# Patient Record
Sex: Male | Born: 1955 | Race: White | Hispanic: No | Marital: Married | Smoking: Never smoker
Health system: Southern US, Community
[De-identification: ages and names within clinical notes are randomized; demographics above are authoritative.]

## PROBLEM LIST (undated history)

## (undated) DIAGNOSIS — M1990 Unspecified osteoarthritis, unspecified site: Secondary | ICD-10-CM

## (undated) DIAGNOSIS — N189 Chronic kidney disease, unspecified: Secondary | ICD-10-CM

## (undated) DIAGNOSIS — J189 Pneumonia, unspecified organism: Secondary | ICD-10-CM

## (undated) HISTORY — PX: HAND TENDON SURGERY: SHX663

## (undated) HISTORY — PX: APPENDECTOMY: SHX54

## (undated) HISTORY — PX: TONSILLECTOMY: SUR1361

## (undated) HISTORY — PX: BACK SURGERY: SHX140

---

## 2005-04-14 ENCOUNTER — Ambulatory Visit: Payer: Self-pay | Admitting: General Practice

## 2006-05-19 ENCOUNTER — Ambulatory Visit: Payer: Self-pay | Admitting: Gastroenterology

## 2008-03-19 ENCOUNTER — Emergency Department (HOSPITAL_COMMUNITY): Admission: EM | Admit: 2008-03-19 | Discharge: 2008-03-19 | Payer: Self-pay | Admitting: Emergency Medicine

## 2009-07-03 ENCOUNTER — Ambulatory Visit: Payer: Self-pay | Admitting: General Practice

## 2009-07-14 ENCOUNTER — Ambulatory Visit: Payer: Self-pay | Admitting: General Practice

## 2009-10-17 ENCOUNTER — Ambulatory Visit: Payer: Self-pay | Admitting: Gastroenterology

## 2010-07-28 ENCOUNTER — Encounter (HOSPITAL_COMMUNITY)
Admission: RE | Admit: 2010-07-28 | Discharge: 2010-07-28 | Disposition: A | Discharge status: Home / Self Care | Payer: BC Managed Care – PPO | Source: Ambulatory Visit | Attending: Neurosurgery | Admitting: Neurosurgery

## 2010-07-28 DIAGNOSIS — Z01812 Encounter for preprocedural laboratory examination: Secondary | ICD-10-CM | POA: Insufficient documentation

## 2010-07-28 DIAGNOSIS — Q762 Congenital spondylolisthesis: Secondary | ICD-10-CM | POA: Insufficient documentation

## 2010-07-28 LAB — TYPE AND SCREEN
ABO/RH(D): A NEG
Antibody Screen: NEGATIVE

## 2010-07-28 LAB — CBC
Hemoglobin: 15.6 g/dL (ref 13.0–17.0)
MCH: 30.6 pg (ref 26.0–34.0)
MCHC: 34.7 g/dL (ref 30.0–36.0)
Platelets: 167 10*3/uL (ref 150–400)
RBC: 5.09 MIL/uL (ref 4.22–5.81)

## 2010-07-30 ENCOUNTER — Inpatient Hospital Stay (HOSPITAL_COMMUNITY): Payer: BC Managed Care – PPO

## 2010-07-30 ENCOUNTER — Observation Stay (HOSPITAL_COMMUNITY)
Admission: RE | Admit: 2010-07-30 | Discharge: 2010-07-31 | DRG: 756 | Disposition: A | Discharge status: Home / Self Care | Payer: BC Managed Care – PPO | Source: Ambulatory Visit | Attending: Neurosurgery | Admitting: Neurosurgery

## 2010-07-30 DIAGNOSIS — G8929 Other chronic pain: Secondary | ICD-10-CM | POA: Insufficient documentation

## 2010-07-30 DIAGNOSIS — Z01812 Encounter for preprocedural laboratory examination: Secondary | ICD-10-CM

## 2010-07-30 DIAGNOSIS — M549 Dorsalgia, unspecified: Secondary | ICD-10-CM | POA: Insufficient documentation

## 2010-07-30 DIAGNOSIS — Q762 Congenital spondylolisthesis: Principal | ICD-10-CM | POA: Diagnosis present

## 2010-08-09 NOTE — Op Note (Signed)
NAMEBRONCO, Steven Tate             ACCOUNT NO.:  [id number]  MEDICAL RECORD NO.:  [id number]           PATIENT TYPE:  I  LOCATION:  3034                         FACILITY:  [facility]  PHYSICIAN:  Reinaldo Meeker, M.D. 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  PROCEDURE:  Bilateral L5-S1 decompression with decompression of L5 and S1 nerve roots bilaterally more so than needed for posterior lumbar interbody fusion followed by L5-S1 bilateral microdiskectomy followed by L5-S1 posterior lumbar interbody fusion with PEEK interbody spacer followed by L5-S1 pedicle screw fixation with Sequoia pedicle screw system followed by L5-S1 posterolateral fusion.  SURGEON:  Reinaldo Meeker, MD  ASSISTANT:  Tia Alert, MD  PROCEDURE IN DETAIL:  After being placed in the prone position, the patient's back was prepped and draped in the usual sterile fashion. Localizing fluoroscopy was used prior to incision to identify the appropriate level.  Midline incision was made above the spinous process of L4, L5 and S1.  Using Bovie cutting current, the incision was carried on the spinous process appeared.  Subperiosteal dissection was then carried out bilaterally on the spinous processes and lamina, far lateral region of L5 and exposed the far lateral aspect of the sacrum.  Self- retaining retractor was placed for exposure and x-rays showed approached to the appropriate level.  Spinous processes of L5 and S1 were removed. Free-floating lamina and inferior facet of L5 were then removed. Superior facet of S1 was then removed.  The abnormal bony overgrowth in the foramen compressing the L5 nerve root was removed bilaterally, so the L5 and S1  nerve roots were both visualized well.  At this time, inspection was carried out bilaterally for any evidence of residual compression, none could be identified.  The disk space was then incised with 15-blade, sequentially distracted up to a 9-mm size.  It was then aggressively cleaned out with a variety of pituitary rongeurs and curettes.  Thorough disk space clean-up was carried out.  At the same time, a great care was taken to avoid injury to the neural elements and this was successfully done.  At this point, disk space was repaired with a variety of scraping instruments for posterior lumbar interbody fusion with PEEK interbody spacer.  A 9 x 10 x 22-mm spacer was chosen and then filled with a mixture of EquivaBone, OsteoSet Plus and autologous bone. This was impacted in the disk space without difficulty.  Fluoroscopy showed to be in good position.  A 9-mm retractor was removed from the opposite side, which was then prepared for interbody fusion as well. Prior to placing the second spacer, we placed a mixture of OsteoSet Plus, EquivaBone and autologous bone in the midline to help with the interbody fusion.  Then impacted the second spacer.  Fluoroscopy showed both spacers to be in good position.  We then placed pedicle screws in standard fashion.  We chose entry point to passed a pedicle  awl.  We then tapped with 5.5-mm tap and placed 65 x 45-mm screws at L5 and 65 x 35-mm screw on one side of S1 and 65 x 40-mm screw on the other side. Fluoroscopy showed that to be in good position.  We then decorticated the far lateral region and placed a mixture of autologous bone, EquivaBone and OsteoSet Plus for posterolateral fusion.  We then tacked the rods and did final tightening with torque and counter torque.  As we tightened the second screw bilaterally, we did compression of L5 and S1. Final fluoroscopy in AP and lateral direction showed good placement of the interbody devices as long as the  screws and rods.  Large amounts of irrigation was carried out.  Any bleeding controlled with bipolar coagulation and Gelfoam.  Hemovac drain was left in the epidural space and brought out through separate stab wound incision.  The wound was then closed in multiple layers of Vicryl in the muscle fascia, subcutaneous and subcuticular tissues.  Staples were placed on the skin. Sterile dressing was then applied and the patient was extubated and taken to the recovery room in stable condition.          ______________________________ Reinaldo Meeker, M.D.     ROK/MEDQ  D:  07/30/2010  T:  07/31/2010  Job:  [id number]  Electronically Signed by Aliene Beams M.D. on 08/09/2010 10:50:38 PM

## 2010-09-09 ENCOUNTER — Ambulatory Visit
Admission: RE | Admit: 2010-09-09 | Discharge: 2010-09-09 | Disposition: A | Discharge status: Home / Self Care | Payer: BC Managed Care – PPO | Source: Ambulatory Visit | Attending: Neurosurgery | Admitting: Neurosurgery

## 2010-09-09 ENCOUNTER — Other Ambulatory Visit: Payer: Self-pay | Admitting: Neurosurgery

## 2010-09-09 DIAGNOSIS — Q762 Congenital spondylolisthesis: Secondary | ICD-10-CM

## 2010-09-16 ENCOUNTER — Ambulatory Visit (HOSPITAL_COMMUNITY)
Admission: RE | Admit: 2010-09-16 | Discharge: 2010-09-16 | Disposition: A | Discharge status: Home / Self Care | Payer: BC Managed Care – PPO | Source: Ambulatory Visit | Attending: Neurosurgery | Admitting: Neurosurgery

## 2010-09-16 DIAGNOSIS — R262 Difficulty in walking, not elsewhere classified: Secondary | ICD-10-CM | POA: Insufficient documentation

## 2010-09-16 DIAGNOSIS — M6281 Muscle weakness (generalized): Secondary | ICD-10-CM | POA: Insufficient documentation

## 2010-09-16 DIAGNOSIS — M545 Low back pain, unspecified: Secondary | ICD-10-CM | POA: Insufficient documentation

## 2010-09-16 DIAGNOSIS — IMO0001 Reserved for inherently not codable concepts without codable children: Secondary | ICD-10-CM | POA: Insufficient documentation

## 2010-09-16 DIAGNOSIS — M25659 Stiffness of unspecified hip, not elsewhere classified: Secondary | ICD-10-CM | POA: Insufficient documentation

## 2010-09-18 ENCOUNTER — Inpatient Hospital Stay (HOSPITAL_COMMUNITY)
Admission: RE | Admit: 2010-09-18 | Discharge: 2010-09-18 | Disposition: A | Discharge status: Home / Self Care | Payer: BC Managed Care – PPO | Source: Ambulatory Visit | Attending: Physical Therapy | Admitting: Physical Therapy

## 2010-09-22 ENCOUNTER — Ambulatory Visit (HOSPITAL_COMMUNITY)
Admission: RE | Admit: 2010-09-22 | Discharge: 2010-09-22 | Disposition: A | Discharge status: Home / Self Care | Payer: BC Managed Care – PPO | Source: Ambulatory Visit | Attending: Neurosurgery | Admitting: Neurosurgery

## 2010-09-22 DIAGNOSIS — M6281 Muscle weakness (generalized): Secondary | ICD-10-CM | POA: Insufficient documentation

## 2010-09-22 DIAGNOSIS — M25659 Stiffness of unspecified hip, not elsewhere classified: Secondary | ICD-10-CM | POA: Insufficient documentation

## 2010-09-22 DIAGNOSIS — IMO0001 Reserved for inherently not codable concepts without codable children: Secondary | ICD-10-CM | POA: Insufficient documentation

## 2010-09-22 DIAGNOSIS — M545 Low back pain, unspecified: Secondary | ICD-10-CM | POA: Insufficient documentation

## 2010-09-22 DIAGNOSIS — R262 Difficulty in walking, not elsewhere classified: Secondary | ICD-10-CM | POA: Insufficient documentation

## 2010-09-24 ENCOUNTER — Ambulatory Visit (HOSPITAL_COMMUNITY)
Admission: RE | Admit: 2010-09-24 | Discharge: 2010-09-24 | Disposition: A | Discharge status: Home / Self Care | Payer: BC Managed Care – PPO | Source: Ambulatory Visit

## 2010-09-29 ENCOUNTER — Ambulatory Visit (HOSPITAL_COMMUNITY)
Admission: RE | Admit: 2010-09-29 | Discharge: 2010-09-29 | Disposition: A | Discharge status: Home / Self Care | Payer: BC Managed Care – PPO | Source: Ambulatory Visit | Attending: Neurosurgery | Admitting: Neurosurgery

## 2010-09-29 DIAGNOSIS — M545 Low back pain, unspecified: Secondary | ICD-10-CM | POA: Insufficient documentation

## 2010-09-29 DIAGNOSIS — M6281 Muscle weakness (generalized): Secondary | ICD-10-CM | POA: Insufficient documentation

## 2010-09-29 DIAGNOSIS — R262 Difficulty in walking, not elsewhere classified: Secondary | ICD-10-CM | POA: Insufficient documentation

## 2010-09-29 DIAGNOSIS — M25659 Stiffness of unspecified hip, not elsewhere classified: Secondary | ICD-10-CM | POA: Insufficient documentation

## 2010-09-29 DIAGNOSIS — IMO0001 Reserved for inherently not codable concepts without codable children: Secondary | ICD-10-CM | POA: Insufficient documentation

## 2010-10-01 ENCOUNTER — Ambulatory Visit (HOSPITAL_COMMUNITY)
Admission: RE | Admit: 2010-10-01 | Discharge: 2010-10-01 | Disposition: A | Discharge status: Home / Self Care | Payer: BC Managed Care – PPO | Source: Ambulatory Visit | Admitting: Physical Therapy

## 2010-10-06 ENCOUNTER — Ambulatory Visit (HOSPITAL_COMMUNITY)
Admission: RE | Admit: 2010-10-06 | Discharge: 2010-10-06 | Disposition: A | Discharge status: Home / Self Care | Payer: BC Managed Care – PPO | Source: Ambulatory Visit | Attending: *Deleted | Admitting: *Deleted

## 2010-10-08 ENCOUNTER — Ambulatory Visit (HOSPITAL_COMMUNITY)
Admission: RE | Admit: 2010-10-08 | Discharge: 2010-10-08 | Disposition: A | Discharge status: Home / Self Care | Payer: BC Managed Care – PPO | Source: Ambulatory Visit | Attending: *Deleted | Admitting: *Deleted

## 2010-10-13 ENCOUNTER — Ambulatory Visit (HOSPITAL_COMMUNITY): Payer: BC Managed Care – PPO | Admitting: *Deleted

## 2010-10-15 ENCOUNTER — Ambulatory Visit (HOSPITAL_COMMUNITY)
Admission: RE | Admit: 2010-10-15 | Discharge: 2010-10-15 | Disposition: A | Discharge status: Home / Self Care | Payer: BC Managed Care – PPO | Source: Ambulatory Visit | Attending: *Deleted | Admitting: *Deleted

## 2010-10-21 ENCOUNTER — Other Ambulatory Visit: Payer: Self-pay | Admitting: Neurosurgery

## 2010-10-21 ENCOUNTER — Ambulatory Visit
Admission: RE | Admit: 2010-10-21 | Discharge: 2010-10-21 | Disposition: A | Discharge status: Home / Self Care | Payer: BC Managed Care – PPO | Source: Ambulatory Visit | Attending: Neurosurgery | Admitting: Neurosurgery

## 2010-10-21 DIAGNOSIS — M545 Low back pain, unspecified: Secondary | ICD-10-CM

## 2011-12-20 ENCOUNTER — Ambulatory Visit: Payer: Self-pay | Admitting: General Practice

## 2012-02-11 ENCOUNTER — Encounter (HOSPITAL_COMMUNITY): Payer: Self-pay | Admitting: Pharmacy Technician

## 2012-02-15 ENCOUNTER — Ambulatory Visit: Payer: Self-pay | Admitting: Gastroenterology

## 2012-02-15 NOTE — Patient Instructions (Signed)
18 Steven Tate  02/15/2012   Your procedure is scheduled on:  02/22/12 1200noon-130pm  Report to [facility] at 0930 AM.  Call this number if you have problems the morning of surgery: [phone]   Remember:   Do not eat food:After Midnight.  May have clear liquids:until Midnight .    Take these medicines the morning of surgery with A SIP OF WATER:    Do not wear jewelry,   Do not wear lotions, powders, or perfumes. .  . Men may shave face and neck.  Do not bring valuables to the hospital.  Contacts, dentures or bridgework may not be worn into surgery.  Leave suitcase in the car. After surgery it may be brought to your room.  For patients admitted to the hospital, checkout time is 11:00 AM the day of discharge.   Special Instructions: CHG Shower Use Special Wash: 1/2 bottle night before surgery and 1/2 bottle morning of surgery. shower chin to toes with CHG.  Wash face and private parts with regular soap.    Please read over the following fact sheets that you were given: MRSA Information, Incentive Spirometry Fact Sheet, Blood Transfusion FAct Sheet, coughing and deep breathing exercise, leg exercises

## 2012-02-16 ENCOUNTER — Encounter (HOSPITAL_COMMUNITY): Payer: Self-pay

## 2012-02-16 ENCOUNTER — Encounter (HOSPITAL_COMMUNITY)
Admission: RE | Admit: 2012-02-16 | Discharge: 2012-02-16 | Disposition: A | Discharge status: Home / Self Care | Payer: BC Managed Care – PPO | Source: Ambulatory Visit | Attending: Orthopedic Surgery | Admitting: Orthopedic Surgery

## 2012-02-16 HISTORY — DX: Unspecified osteoarthritis, unspecified site: M19.90

## 2012-02-16 HISTORY — DX: Chronic kidney disease, unspecified: N18.9

## 2012-02-16 HISTORY — DX: Pneumonia, unspecified organism: J18.9

## 2012-02-16 LAB — URINALYSIS, ROUTINE W REFLEX MICROSCOPIC
Bilirubin Urine: NEGATIVE
Leukocytes, UA: NEGATIVE
Nitrite: NEGATIVE
Specific Gravity, Urine: 1.017 (ref 1.005–1.030)
Urobilinogen, UA: 0.2 mg/dL (ref 0.0–1.0)
pH: 5.5 (ref 5.0–8.0)

## 2012-02-16 LAB — BASIC METABOLIC PANEL
BUN: 13 mg/dL (ref 6–23)
Chloride: 104 mEq/L (ref 96–112)
GFR calc Af Amer: >90 mL/min (ref >90)
GFR calc non Af Amer: >90 mL/min (ref >90)
Glucose, Bld: 104 mg/dL — ABNORMAL HIGH (ref 70–99)
Potassium: 3.9 mEq/L (ref 3.5–5.1)
Sodium: 136 mEq/L (ref 135–145)

## 2012-02-16 LAB — CBC
HCT: 44.2 % (ref 39.0–52.0)
Hemoglobin: 15.4 g/dL (ref 13.0–17.0)
MCHC: 34.8 g/dL (ref 30.0–36.0)
RBC: 5.19 MIL/uL (ref 4.22–5.81)

## 2012-02-16 LAB — SURGICAL PCR SCREEN
MRSA, PCR: NEGATIVE
Staphylococcus aureus: NEGATIVE

## 2012-02-16 LAB — PROTIME-INR
INR: 0.97 (ref 0.00–1.49)
Prothrombin Time: 13.1 seconds (ref 11.6–15.2)

## 2012-02-17 NOTE — H&P (Signed)
Steven Tate is an 56 y.o. male.    Chief Complaint:  Right hip OA and pain   HPI: Pt is a 57 y.o. male complaining of hip pain for about 30 years, when he was diagnosed with a spur on the left hip. Five years ago he started having right hip pain.  Pain had continually increased since the beginning, to the point where he has significant pain and almost no motion in the right hip and significantly decreased motion and pain in the left hip. Patient feels that the right hip is worse than the left hip at this time.X-rays in the clinic show end-stage arthritic changes of the bilateral hip. Pt has tried various conservative treatments which have failed to alleviate their symptoms. Various options are discussed with the patient. Risks, benefits and expectations were discussed with the patient. Patient understand the risks, benefits and expectations and wishes to proceed with surgery.   PCP:  No primary provider on file.  D/C Plans:  Home with HHPT  Post-op Meds:   Rx given for ASA, Robaxin, Celebrex, Iron, Colace and MiraLax  Tranexamic Acid:   To be given  Decadron:   To be given  PMH: Past Medical History  Diagnosis Date  . Pneumonia     hx of 2001  . GERD (gastroesophageal reflux disease)   . Chronic kidney disease     hx of uti   . Arthritis     PSH: Past Surgical History  Procedure Date  . Back surgery   . Appendectomy   . Tonsillectomy     Social History:  reports that he has never smoked. He quit smokeless tobacco use about 4 years ago. His smokeless tobacco use included Snuff. He reports that he does not drink alcohol or use illicit drugs.  Allergies:  No Known Allergies  Medications: No current facility-administered medications for this encounter.   Current Outpatient Prescriptions  Medication Sig Dispense Refill  . meloxicam (MOBIC) 15 MG tablet Take 15 mg by mouth daily with breakfast.      . omeprazole (PRILOSEC) 20 MG capsule Take 20 mg by mouth daily.       Marland Kitchen testosterone enanthate (DELATESTRYL) 200 MG/ML injection Inject 200 mg into the muscle every 28 (twenty-eight) days. For IM use only      . zolpidem (AMBIEN) 10 MG tablet Take 10 mg by mouth at bedtime as needed. Sleep        Results for orders placed during the hospital encounter of 02/16/12 (from the past 48 hour(s))  URINALYSIS, ROUTINE W REFLEX MICROSCOPIC     Status: Normal   Collection Time   02/16/12  1:46 PM      Component Value Range Comment   Color, Urine YELLOW  YELLOW    APPearance CLEAR  CLEAR    Specific Gravity, Urine 1.017  1.005 - 1.030    pH 5.5  5.0 - 8.0    Glucose, UA NEGATIVE  NEGATIVE mg/dL    Hgb urine dipstick NEGATIVE  NEGATIVE    Bilirubin Urine NEGATIVE  NEGATIVE    Ketones, ur NEGATIVE  NEGATIVE mg/dL    Protein, ur NEGATIVE  NEGATIVE mg/dL    Urobilinogen, UA 0.2  0.0 - 1.0 mg/dL    Nitrite NEGATIVE  NEGATIVE    Leukocytes, UA NEGATIVE  NEGATIVE MICROSCOPIC NOT DONE ON URINES WITH NEGATIVE PROTEIN, BLOOD, LEUKOCYTES, NITRITE, OR GLUCOSE <1000 mg/dL.  SURGICAL PCR SCREEN     Status: Normal   Collection Time  02/16/12  1:46 PM      Component Value Range Comment   MRSA, PCR NEGATIVE  NEGATIVE    Staphylococcus aureus NEGATIVE  NEGATIVE   APTT     Status: Normal   Collection Time   02/16/12  2:15 PM      Component Value Range Comment   aPTT 33  24 - 37 seconds   BASIC METABOLIC PANEL     Status: Abnormal   Collection Time   02/16/12  2:15 PM      Component Value Range Comment   Sodium 136  135 - 145 mEq/L    Potassium 3.9  3.5 - 5.1 mEq/L    Chloride 104  96 - 112 mEq/L    CO2 23  19 - 32 mEq/L    Glucose, Bld 104 (*) 70 - 99 mg/dL    BUN 13  6 - 23 mg/dL    Creatinine, Ser 1.61  0.50 - 1.35 mg/dL    Calcium 9.0  8.4 - 09.6 mg/dL    GFR calc non Af Amer >90  >90 mL/min    GFR calc Af Amer >90  >90 mL/min   CBC     Status: Normal   Collection Time   02/16/12  2:15 PM      Component Value Range Comment   WBC 9.1  4.0 - 10.5 K/uL    RBC 5.19   4.22 - 5.81 MIL/uL    Hemoglobin 15.4  13.0 - 17.0 g/dL    HCT 04.5  40.9 - 81.1 %    MCV 85.2  78.0 - 100.0 fL    MCH 29.7  26.0 - 34.0 pg    MCHC 34.8  30.0 - 36.0 g/dL    RDW 91.4  78.2 - 95.6 %    Platelets 225  150 - 400 K/uL   PROTIME-INR     Status: Normal   Collection Time   02/16/12  2:15 PM      Component Value Range Comment   Prothrombin Time 13.1  11.6 - 15.2 seconds    INR 0.97  0.00 - 1.49     ROS: Review of Systems  Constitutional: Negative.   HENT: Positive for tinnitus.   Eyes: Negative.   Respiratory: Negative.   Cardiovascular: Negative.   Gastrointestinal: Negative.   Genitourinary: Negative.   Musculoskeletal: Positive for myalgias, back pain and joint pain.  Skin: Negative.   Neurological: Negative.   Endo/Heme/Allergies: Negative.   Psychiatric/Behavioral: The patient has insomnia.      Physical Exam: BP:   133/75  ;  HR:   75  ; Resp:   16  ; Physical Exam  Constitutional: He is oriented to person, place, and time and well-developed, well-nourished, and in no distress.  HENT:  Head: Normocephalic and atraumatic.  Nose: Nose normal.  Mouth/Throat: Oropharynx is clear and moist.  Eyes: Pupils are equal, round, and reactive to light.  Neck: Neck supple. No JVD present. No tracheal deviation present. No thyromegaly present.  Cardiovascular: Normal rate, regular rhythm, normal heart sounds and intact distal pulses.   Pulmonary/Chest: Effort normal and breath sounds normal. No stridor.  Abdominal: Soft. There is no tenderness. There is no guarding.  Musculoskeletal:       Right hip: He exhibits decreased range of motion, decreased strength, tenderness and bony tenderness. He exhibits no swelling, no deformity and no laceration.       Left hip: He exhibits decreased range of motion, decreased strength, tenderness  and bony tenderness. He exhibits no swelling, no deformity and no laceration.       Right ankle: He exhibits normal pulse.  Lymphadenopathy:      He has no cervical adenopathy.  Neurological: He is alert and oriented to person, place, and time.  Skin: Skin is warm and dry.  Psychiatric: Affect normal.     Assessment/Plan Assessment:   Right hip OA and pain   Plan: Patient will undergo a right total hip arthroplasty, anterior approach on 02/22/2012 per Dr. Charlann Boxer at [facility]. Risks benefits and expectations were discussed with the patient. Patient understand risks, benefits and expectations and wishes to proceed.   Anastasio Auerbach Glenn Gullickson   PAC  02/17/2012, 3:05 PM

## 2012-02-22 ENCOUNTER — Ambulatory Visit (HOSPITAL_COMMUNITY): Payer: BC Managed Care – PPO

## 2012-02-22 ENCOUNTER — Inpatient Hospital Stay (HOSPITAL_COMMUNITY)
Admission: RE | Admit: 2012-02-22 | Discharge: 2012-02-23 | DRG: 818 | Disposition: A | Discharge status: Home Health | Payer: BC Managed Care – PPO | Source: Ambulatory Visit | Attending: Orthopedic Surgery | Admitting: Orthopedic Surgery

## 2012-02-22 ENCOUNTER — Encounter (HOSPITAL_COMMUNITY)
Admission: RE | Disposition: A | Discharge status: Home Health | Payer: Self-pay | Source: Ambulatory Visit | Attending: Orthopedic Surgery

## 2012-02-22 ENCOUNTER — Encounter (HOSPITAL_COMMUNITY): Payer: Self-pay | Admitting: *Deleted

## 2012-02-22 ENCOUNTER — Encounter (HOSPITAL_COMMUNITY): Payer: Self-pay | Admitting: Anesthesiology

## 2012-02-22 ENCOUNTER — Ambulatory Visit (HOSPITAL_COMMUNITY): Payer: BC Managed Care – PPO | Admitting: Anesthesiology

## 2012-02-22 DIAGNOSIS — M169 Osteoarthritis of hip, unspecified: Principal | ICD-10-CM | POA: Diagnosis present

## 2012-02-22 DIAGNOSIS — D62 Acute posthemorrhagic anemia: Secondary | ICD-10-CM | POA: Diagnosis not present

## 2012-02-22 DIAGNOSIS — M1610 Unilateral primary osteoarthritis, unspecified hip: Principal | ICD-10-CM | POA: Diagnosis present

## 2012-02-22 DIAGNOSIS — Z01812 Encounter for preprocedural laboratory examination: Secondary | ICD-10-CM

## 2012-02-22 DIAGNOSIS — D500 Iron deficiency anemia secondary to blood loss (chronic): Secondary | ICD-10-CM

## 2012-02-22 DIAGNOSIS — Z96649 Presence of unspecified artificial hip joint: Secondary | ICD-10-CM

## 2012-02-22 HISTORY — PX: TOTAL HIP ARTHROPLASTY: SHX124

## 2012-02-22 LAB — TYPE AND SCREEN: ABO/RH(D): A NEG

## 2012-02-22 SURGERY — ARTHROPLASTY, HIP, TOTAL, ANTERIOR APPROACH
Anesthesia: Spinal | Site: Hip | Laterality: Right | Wound class: Clean

## 2012-02-22 MED ORDER — PHENYLEPHRINE HCL 10 MG/ML IJ SOLN
INTRAMUSCULAR | Status: DC | PRN
  Administered 2012-02-22 (×4): 40 ug via INTRAVENOUS

## 2012-02-22 MED ORDER — METHOCARBAMOL 100 MG/ML IJ SOLN
500.0000 mg | Freq: Four times a day (QID) | INTRAVENOUS | Status: DC | PRN
  Filled 2012-02-22: qty 5

## 2012-02-22 MED ORDER — SODIUM CHLORIDE 0.9 % IV SOLN
100.0000 mL/h | INTRAVENOUS | Status: DC
  Administered 2012-02-22: 100 mL/h via INTRAVENOUS
  Filled 2012-02-22 (×5): qty 1000

## 2012-02-22 MED ORDER — FLEET ENEMA 7-19 GM/118ML RE ENEM: 1.0000 | ENEMA | Freq: Once | RECTAL | Status: AC | PRN

## 2012-02-22 MED ORDER — ZOLPIDEM TARTRATE 10 MG PO TABS: 10.0000 mg | ORAL_TABLET | Freq: Every evening | ORAL | Status: DC | PRN

## 2012-02-22 MED ORDER — CEFAZOLIN SODIUM-DEXTROSE 2-3 GM-% IV SOLR
2.0000 g | INTRAVENOUS | Status: AC
  Administered 2012-02-22: 2 g via INTRAVENOUS

## 2012-02-22 MED ORDER — PANTOPRAZOLE SODIUM 40 MG PO TBEC
40.0000 mg | DELAYED_RELEASE_TABLET | Freq: Every day | ORAL | Status: DC
  Administered 2012-02-23: 40 mg via ORAL
  Filled 2012-02-22: qty 1

## 2012-02-22 MED ORDER — FERROUS SULFATE 325 (65 FE) MG PO TABS
325.0000 mg | ORAL_TABLET | Freq: Three times a day (TID) | ORAL | Status: DC
  Administered 2012-02-22 – 2012-02-23 (×3): 325 mg via ORAL
  Filled 2012-02-22 (×5): qty 1

## 2012-02-22 MED ORDER — DEXAMETHASONE SODIUM PHOSPHATE 10 MG/ML IJ SOLN
10.0000 mg | Freq: Once | INTRAMUSCULAR | Status: DC
  Filled 2012-02-22: qty 1

## 2012-02-22 MED ORDER — PROPOFOL INFUSION 10 MG/ML OPTIME
INTRAVENOUS | Status: DC | PRN
  Administered 2012-02-22: 50 ug/kg/min via INTRAVENOUS

## 2012-02-22 MED ORDER — ACETAMINOPHEN 10 MG/ML IV SOLN
INTRAVENOUS | Status: DC | PRN
  Administered 2012-02-22: 1000 mg via INTRAVENOUS

## 2012-02-22 MED ORDER — MENTHOL 3 MG MT LOZG: 1.0000 | LOZENGE | OROMUCOSAL | Status: DC | PRN

## 2012-02-22 MED ORDER — ONDANSETRON HCL 4 MG/2ML IJ SOLN
4.0000 mg | Freq: Four times a day (QID) | INTRAMUSCULAR | Status: DC | PRN
  Administered 2012-02-23: 4 mg via INTRAVENOUS
  Filled 2012-02-22: qty 2

## 2012-02-22 MED ORDER — METHOCARBAMOL 500 MG PO TABS
500.0000 mg | ORAL_TABLET | Freq: Four times a day (QID) | ORAL | Status: DC | PRN
  Administered 2012-02-22 – 2012-02-23 (×3): 500 mg via ORAL
  Filled 2012-02-22 (×3): qty 1

## 2012-02-22 MED ORDER — PROPOFOL 10 MG/ML IV BOLUS
INTRAVENOUS | Status: DC | PRN
  Administered 2012-02-22 (×4): 20 mg via INTRAVENOUS

## 2012-02-22 MED ORDER — DOCUSATE SODIUM 100 MG PO CAPS
100.0000 mg | ORAL_CAPSULE | Freq: Two times a day (BID) | ORAL | Status: DC
  Administered 2012-02-22 – 2012-02-23 (×2): 100 mg via ORAL

## 2012-02-22 MED ORDER — CEFAZOLIN SODIUM-DEXTROSE 2-3 GM-% IV SOLR
2.0000 g | Freq: Four times a day (QID) | INTRAVENOUS | Status: AC
  Administered 2012-02-22 (×2): 2 g via INTRAVENOUS
  Filled 2012-02-22 (×2): qty 50

## 2012-02-22 MED ORDER — ONDANSETRON HCL 4 MG PO TABS: 4.0000 mg | ORAL_TABLET | Freq: Four times a day (QID) | ORAL | Status: DC | PRN

## 2012-02-22 MED ORDER — BUPIVACAINE HCL (PF) 0.5 % IJ SOLN
INTRAMUSCULAR | Status: DC | PRN
  Administered 2012-02-22: 15 mg

## 2012-02-22 MED ORDER — DEXAMETHASONE SODIUM PHOSPHATE 10 MG/ML IJ SOLN
10.0000 mg | Freq: Once | INTRAMUSCULAR | Status: AC
  Administered 2012-02-23: 10 mg via INTRAVENOUS
  Filled 2012-02-22: qty 1

## 2012-02-22 MED ORDER — BISACODYL 10 MG RE SUPP: 10.0000 mg | Freq: Every day | RECTAL | Status: DC | PRN

## 2012-02-22 MED ORDER — METOCLOPRAMIDE HCL 5 MG/ML IJ SOLN
5.0000 mg | Freq: Three times a day (TID) | INTRAMUSCULAR | Status: DC | PRN
  Administered 2012-02-22: 10 mg via INTRAVENOUS
  Filled 2012-02-22: qty 2

## 2012-02-22 MED ORDER — CHLORHEXIDINE GLUCONATE 4 % EX LIQD
60.0000 mL | Freq: Once | CUTANEOUS | Status: DC
  Filled 2012-02-22: qty 60

## 2012-02-22 MED ORDER — TRANEXAMIC ACID 100 MG/ML IV SOLN
1440.0000 mg | Freq: Once | INTRAVENOUS | Status: AC
  Administered 2012-02-22: 1440 mg via INTRAVENOUS
  Filled 2012-02-22: qty 14.4

## 2012-02-22 MED ORDER — POLYETHYLENE GLYCOL 3350 17 G PO PACK
17.0000 g | PACK | Freq: Two times a day (BID) | ORAL | Status: DC
  Administered 2012-02-22 – 2012-02-23 (×2): 17 g via ORAL

## 2012-02-22 MED ORDER — LACTATED RINGERS IV SOLN
INTRAVENOUS | Status: DC
  Administered 2012-02-22: 1000 mL via INTRAVENOUS
  Administered 2012-02-22: 13:00:00 via INTRAVENOUS
  Administered 2012-02-22: 1000 mL via INTRAVENOUS

## 2012-02-22 MED ORDER — RIVAROXABAN 10 MG PO TABS
10.0000 mg | ORAL_TABLET | ORAL | Status: DC
  Administered 2012-02-23: 10 mg via ORAL
  Filled 2012-02-22 (×2): qty 1

## 2012-02-22 MED ORDER — CEFAZOLIN SODIUM-DEXTROSE 2-3 GM-% IV SOLR
INTRAVENOUS | Status: AC
  Filled 2012-02-22: qty 50

## 2012-02-22 MED ORDER — PHENOL 1.4 % MT LIQD: 1.0000 | OROMUCOSAL | Status: DC | PRN

## 2012-02-22 MED ORDER — CELECOXIB 200 MG PO CAPS
200.0000 mg | ORAL_CAPSULE | Freq: Two times a day (BID) | ORAL | Status: DC
  Administered 2012-02-22 – 2012-02-23 (×2): 200 mg via ORAL
  Filled 2012-02-22 (×3): qty 1

## 2012-02-22 MED ORDER — ALUM & MAG HYDROXIDE-SIMETH 200-200-20 MG/5ML PO SUSP: 30.0000 mL | ORAL | Status: DC | PRN

## 2012-02-22 MED ORDER — METOCLOPRAMIDE HCL 10 MG PO TABS: 5.0000 mg | ORAL_TABLET | Freq: Three times a day (TID) | ORAL | Status: DC | PRN

## 2012-02-22 MED ORDER — LACTATED RINGERS IV BOLUS (SEPSIS)
500.0000 mL | Freq: Once | INTRAVENOUS | Status: AC
  Administered 2012-02-22: 500 mL via INTRAVENOUS

## 2012-02-22 MED ORDER — HYDROMORPHONE HCL PF 1 MG/ML IJ SOLN
0.5000 mg | INTRAMUSCULAR | Status: DC | PRN
  Administered 2012-02-22 (×2): 1 mg via INTRAVENOUS
  Filled 2012-02-22 (×2): qty 1

## 2012-02-22 MED ORDER — PHENYLEPHRINE HCL 10 MG/ML IJ SOLN
10.0000 mg | INTRAVENOUS | Status: DC | PRN
  Administered 2012-02-22: 40 ug/min via INTRAVENOUS

## 2012-02-22 MED ORDER — HYDROCODONE-ACETAMINOPHEN 7.5-325 MG PO TABS
1.0000 | ORAL_TABLET | ORAL | Status: DC
  Administered 2012-02-22 – 2012-02-23 (×5): 2 via ORAL
  Filled 2012-02-22 (×5): qty 2

## 2012-02-22 MED ORDER — 0.9 % SODIUM CHLORIDE (POUR BTL) OPTIME
TOPICAL | Status: DC | PRN
  Administered 2012-02-22: 1000 mL

## 2012-02-22 MED ORDER — BUPIVACAINE HCL (PF) 0.5 % IJ SOLN
INTRAMUSCULAR | Status: AC
  Filled 2012-02-22: qty 30

## 2012-02-22 MED ORDER — ACETAMINOPHEN 10 MG/ML IV SOLN
INTRAVENOUS | Status: AC
  Filled 2012-02-22: qty 100

## 2012-02-22 MED ORDER — DIPHENHYDRAMINE HCL 25 MG PO CAPS: 25.0000 mg | ORAL_CAPSULE | Freq: Four times a day (QID) | ORAL | Status: DC | PRN

## 2012-02-22 SURGICAL SUPPLY — 38 items
BAG ZIPLOCK 12X15 (MISCELLANEOUS) ×4 IMPLANT
BLADE SAW SGTL 18X1.27X75 (BLADE) ×2 IMPLANT
CLOTH BEACON ORANGE TIMEOUT ST (SAFETY) ×2 IMPLANT
DERMABOND ADVANCED (GAUZE/BANDAGES/DRESSINGS) ×1
DERMABOND ADVANCED .7 DNX12 (GAUZE/BANDAGES/DRESSINGS) ×1 IMPLANT
DRAPE C-ARM 42X72 X-RAY (DRAPES) ×2 IMPLANT
DRAPE STERI IOBAN 125X83 (DRAPES) ×2 IMPLANT
DRAPE U-SHAPE 47X51 STRL (DRAPES) ×6 IMPLANT
DRSG AQUACEL AG ADV 3.5X10 (GAUZE/BANDAGES/DRESSINGS) ×2 IMPLANT
DRSG TEGADERM 4X4.75 (GAUZE/BANDAGES/DRESSINGS) ×2 IMPLANT
DURAPREP 26ML APPLICATOR (WOUND CARE) ×2 IMPLANT
ELECT BLADE TIP CTD 4 INCH (ELECTRODE) ×2 IMPLANT
ELECT REM PT RETURN 9FT ADLT (ELECTROSURGICAL) ×2
ELECTRODE REM PT RTRN 9FT ADLT (ELECTROSURGICAL) ×1 IMPLANT
EVACUATOR 1/8 PVC DRAIN (DRAIN) IMPLANT
FACESHIELD LNG OPTICON STERILE (SAFETY) ×8 IMPLANT
GAUZE SPONGE 2X2 8PLY STRL LF (GAUZE/BANDAGES/DRESSINGS) ×1 IMPLANT
GLOVE BIOGEL PI IND STRL 7.5 (GLOVE) ×1 IMPLANT
GLOVE BIOGEL PI IND STRL 8 (GLOVE) ×1 IMPLANT
GLOVE BIOGEL PI INDICATOR 7.5 (GLOVE) ×1
GLOVE BIOGEL PI INDICATOR 8 (GLOVE) ×1
GLOVE ECLIPSE 8.0 STRL XLNG CF (GLOVE) ×2 IMPLANT
GLOVE ORTHO TXT STRL SZ7.5 (GLOVE) ×4 IMPLANT
GOWN BRE IMP PREV XXLGXLNG (GOWN DISPOSABLE) ×4 IMPLANT
GOWN STRL NON-REIN LRG LVL3 (GOWN DISPOSABLE) ×2 IMPLANT
KIT BASIN OR (CUSTOM PROCEDURE TRAY) ×2 IMPLANT
PACK TOTAL JOINT (CUSTOM PROCEDURE TRAY) ×2 IMPLANT
PADDING CAST COTTON 6X4 STRL (CAST SUPPLIES) ×2 IMPLANT
SPONGE GAUZE 2X2 STER 10/PKG (GAUZE/BANDAGES/DRESSINGS) ×1
SUCTION FRAZIER 12FR DISP (SUCTIONS) ×2 IMPLANT
SUT MNCRL AB 4-0 PS2 18 (SUTURE) ×2 IMPLANT
SUT VIC AB 1 CT1 36 (SUTURE) ×8 IMPLANT
SUT VIC AB 2-0 CT1 27 (SUTURE) ×2
SUT VIC AB 2-0 CT1 TAPERPNT 27 (SUTURE) ×2 IMPLANT
SUT VLOC 180 0 24IN GS25 (SUTURE) ×2 IMPLANT
TOWEL OR 17X26 10 PK STRL BLUE (TOWEL DISPOSABLE) ×4 IMPLANT
TRAY FOLEY CATH 14FRSI W/METER (CATHETERS) ×2 IMPLANT
WATER STERILE IRR 1500ML POUR (IV SOLUTION) ×4 IMPLANT

## 2012-02-22 NOTE — Progress Notes (Signed)
Utilization review completed.  

## 2012-02-22 NOTE — Transfer of Care (Signed)
Immediate Anesthesia Transfer of Care Note  Patient: Steven Tate  Procedure(s) Performed: Procedure(s) (LRB): TOTAL HIP ARTHROPLASTY ANTERIOR APPROACH (Right)  Patient Location: PACU  Anesthesia Type: Spinal  Level of Consciousness: sedated, patient cooperative and responds to stimulaton  Airway & Oxygen Therapy: Patient Spontanous Breathing and Patient connected to face mask oxgen  Post-op Assessment: Report given to PACU RN and Post -op Vital signs reviewed and stable  Post vital signs: Reviewed and stable  Complications: No apparent anesthesia complications T-12 level on release to PACU staff.

## 2012-02-22 NOTE — Anesthesia Procedure Notes (Signed)
Spinal  Patient location during procedure: OR Start time: 02/22/2012 11:44 AM End time: 02/22/2012 11:49 AM Staffing Anesthesiologist: Lewie Loron R Performed by: anesthesiologist  Preanesthetic Checklist Completed: patient identified, site marked, surgical consent, pre-op evaluation, timeout performed, IV checked, risks and benefits discussed and monitors and equipment checked Spinal Block Patient position: sitting Prep: ChloraPrep Patient monitoring: heart rate, continuous pulse ox and blood pressure Approach: midline Location: L3-4 Injection technique: single-shot Needle Needle type: Sprotte  Needle gauge: 24 G Needle length: 9 cm Needle insertion depth: 5 cm Assessment Sensory level: T8 Additional Notes Expiration date of kit checked and confirmed. Patient tolerated procedure well, without complications.

## 2012-02-22 NOTE — Anesthesia Postprocedure Evaluation (Signed)
Anesthesia Post Note  Patient: Steven Tate  Procedure(s) Performed: Procedure(s) (LRB): TOTAL HIP ARTHROPLASTY ANTERIOR APPROACH (Right)  Anesthesia type: Spinal  Patient location: PACU  Post pain: Pain level controlled  Post assessment: Post-op Vital signs reviewed  Last Vitals: BP 119/72  Pulse 68  Temp 36.2 C (Oral)  Resp 16  Ht 6\' 2"  (1.88 m)  Wt 212 lb (96.163 kg)  BMI 27.22 kg/m2  SpO2 100%  Post vital signs: Reviewed  Level of consciousness: sedated  Complications: No apparent anesthesia complications

## 2012-02-22 NOTE — Plan of Care (Signed)
Problem: Consults Goal: Diagnosis- Total Joint Replacement Outcome: Completed/Met Date Met:  02/22/12 Right anterior hip

## 2012-02-22 NOTE — Op Note (Signed)
NAME:  Steven Tate                ACCOUNT NO.: [id number]      MEDICAL RECORD NO.: [id number]      FACILITY:  [facility]      PHYSICIAN:  Durene Romans D  DATE OF BIRTH:  October 13, 1955     DATE OF PROCEDURE:  02/22/2012                                 OPERATIVE REPORT         PREOPERATIVE DIAGNOSIS: Right  hip osteoarthritis.      POSTOPERATIVE DIAGNOSIS:  Right hip osteoarthritis.      PROCEDURE:  Right total hip replacement through an anterior approach   utilizing DePuy THR system, component size 54mm pinnacle cup, a size 36+4 neutral   Altrex liner, a size 6 Hi Tri Lock stem with a 36+5 delta ceramic   ball.      SURGEON:  Madlyn Frankel. Charlann Boxer, M.D.      ASSISTANT:  Lanney Gins, PA      ANESTHESIA:  Spinal.      SPECIMENS:  None.      COMPLICATIONS:  None.      BLOOD LOSS:  500 cc     DRAINS:  One Hemovac.      INDICATION OF THE PROCEDURE:  Steven Tate is a 56 y.o. male who had   presented to office for evaluation of right hip pain.  Radiographs revealed   progressive degenerative changes with bone-on-bone   articulation to the  hip joint.  The patient had painful limited range of   motion significantly affecting their overall quality of life.  The patient was failing to    respond to conservative measures, and at this point was ready   to proceed with more definitive measures.  The patient has noted progressive   degenerative changes in his hip, progressive problems and dysfunction   with regarding the hip prior to surgery.  Consent was obtained for   benefit of pain relief.  Specific risk of infection, DVT, component   failure, dislocation, need for revision surgery, as well discussion of   the anterior versus posterior approach were reviewed.  Consent was   obtained for benefit of anterior pain relief through an anterior   approach.      PROCEDURE IN DETAIL:  The patient was brought to operative theater.   Once adequate anesthesia,  preoperative antibiotics, 2gm Ancef administered.   The patient was positioned supine on the OSI Hanna table.  Once adequate   padding of boney process was carried out, we had predraped out the hip, and  used fluoroscopy to confirm orientation of the pelvis and position.      The right hip was then prepped and draped from proximal iliac crest to   mid thigh with shower curtain technique.      Time-out was performed identifying the patient, planned procedure, and   extremity.     An incision was then made 2 cm distal and lateral to the   anterior superior iliac spine extending over the orientation of the   tensor fascia lata muscle and sharp dissection was carried down to the   fascia of the muscle and protractor placed in the soft tissues.      The fascia was then incised.  The muscle belly was identified and  swept   laterally and retractor placed along the superior neck.  Following   cauterization of the circumflex vessels and removing some pericapsular   fat, a second cobra retractor was placed on the inferior neck.  A third   retractor was placed on the anterior acetabulum after elevating the   anterior rectus.  A L-capsulotomy was along the line of the   superior neck to the trochanteric fossa, then extended proximally and   distally.  Tag sutures were placed and the retractors were then placed   intracapsular.  We then identified the trochanteric fossa and   orientation of my neck cut, confirmed this radiographically   and then made a neck osteotomy with the femur on traction.  The femoral   head was removed without difficulty or complication.  Traction was let   off and retractors were placed posterior and anterior around the   acetabulum.      The labrum and foveal tissue were debrided.  I began reaming with a 47mm   reamer and reamed up to 53mm reamer with good bony bed preparation and a 54   cup was chosen.  The final 54mm Pinnacle cup was then impacted under fluoroscopy  to  confirm the depth of penetration and orientation with respect to   abduction.  A screw was placed followed by the hole eliminator.  The final   36+4 Altrex liner was impacted with good visualized rim fit.  The cup was positioned anatomically within the acetabular portion of the pelvis.      At this point, the femur was rolled at 80 degrees.  Further capsule was   released off the inferior aspect of the femoral neck.  I then   released the superior capsule proximally.  The hook was placed laterally   along the femur and elevated manually and held in position with the bed   hook.  The leg was then extended and adducted with the leg rolled to 100   degrees of external rotation.  Once the proximal femur was fully   exposed, I used a box osteotome to set orientation.  I then began   broaching with the starting chili pepper broach and passed this by hand and then broached up to 6.  With the 6 broach in place I chose a high offset neck and did a trial reduction.  The offset was appropriate, leg lengths   appeared to be equal, confirmed radiographically.  He has very bad arthritis involving his contralateral hip, and it is the plans to have it replaced soon.  I was a this point trying to restore native anatomy as much as removing arthritic joints.   Given these findings, I went ahead and dislocated the hip, repositioned all   retractors and positioned the right hip in the extended and abducted position.  The final 6 Hi  Tri Lock stem was   chosen and it was impacted down to the level of neck cut.  Based on this   and the trial reduction, a 36+5 delta ceramic ball was chosen and   impacted onto a clean and dry trunnion, and the hip was reduced.  The   hip had been irrigated throughout the case again at this point.  I did   reapproximate the superior capsular leaflet to the anterior leaflet   using #1 Vicryl, placed a medium Hemovac drain deep.  The fascia of the   tensor fascia lata muscle was then  reapproximated using #1 Vicryl.  The   remaining wound was closed with 2-0 Vicryl and running 4-0 Monocryl.   The hip was cleaned, dried, and dressed sterilely using Dermabond and   Aquacel dressing.  Drain site dressed separately.  She was then brought   to recovery room in stable condition tolerating the procedure well.    Lanney Gins, PA-C was present for the entirety of the case involved from   preoperative positioning, perioperative retractor management, general   facilitation of the case, as well as primary wound closure as assistant.            Madlyn Frankel Charlann Boxer, M.D.            MDO/MEDQ  D:  04/13/2011  T:  04/13/2011  Job:  [id number]      Electronically Signed by Durene Romans M.D. on 04/19/2011 09:15:38 AM

## 2012-02-22 NOTE — Progress Notes (Signed)
Dr. Renold Don, anesthes. Made aware of pt's blood pressure; IV fluids continued and will continue to observe

## 2012-02-22 NOTE — Anesthesia Preprocedure Evaluation (Addendum)
Anesthesia Evaluation  Patient identified by MRN, date of birth, ID band Patient awake    Reviewed: Allergy & Precautions, H&P , NPO status , Patient's Chart, lab work & pertinent test results  Airway Mallampati: I TM Distance: >3 FB Neck ROM: Full    Dental  (+) Teeth Intact and Dental Advisory Given   Pulmonary neg pulmonary ROS, pneumonia -, resolved,  breath sounds clear to auscultation- rhonchi  Pulmonary exam normal       Cardiovascular Exercise Tolerance: Good negative cardio ROS  Rhythm:Regular Rate:Normal     Neuro/Psych negative neurological ROS  negative psych ROS   GI/Hepatic Neg liver ROS, PPI for ulcer prophylaxis with NSAIDs   Endo/Other  negative endocrine ROS  Renal/GU Renal diseasenegative Renal ROS     Musculoskeletal negative musculoskeletal ROS (+)   Abdominal   Peds  Hematology negative hematology ROS (+)   Anesthesia Other Findings   Reproductive/Obstetrics                          Anesthesia Physical Anesthesia Plan  ASA: II  Anesthesia Plan: Spinal   Post-op Pain Management:    Induction:   Airway Management Planned: Simple Face Mask  Additional Equipment:   Intra-op Plan:   Post-operative Plan: Extubation in OR  Informed Consent: I have reviewed the patients History and Physical, chart, labs and discussed the procedure including the risks, benefits and alternatives for the proposed anesthesia with the patient or authorized representative who has indicated his/her understanding and acceptance.   Dental advisory given  Plan Discussed with: CRNA and Surgeon  Anesthesia Plan Comments:         Anesthesia Quick Evaluation

## 2012-02-23 DIAGNOSIS — D500 Iron deficiency anemia secondary to blood loss (chronic): Secondary | ICD-10-CM

## 2012-02-23 LAB — CBC
MCH: 29.7 pg (ref 26.0–34.0)
MCHC: 34 g/dL (ref 30.0–36.0)
MCV: 87.1 fL (ref 78.0–100.0)
Platelets: 139 10*3/uL — ABNORMAL LOW (ref 150–400)
RBC: 3.81 MIL/uL — ABNORMAL LOW (ref 4.22–5.81)

## 2012-02-23 LAB — BASIC METABOLIC PANEL
CO2: 27 mEq/L (ref 19–32)
Calcium: 7.6 mg/dL — ABNORMAL LOW (ref 8.4–10.5)
Creatinine, Ser: 0.84 mg/dL (ref 0.50–1.35)
GFR calc non Af Amer: >90 mL/min (ref >90)

## 2012-02-23 MED ORDER — PROMETHAZINE HCL 12.5 MG PO TABS: 12.5000 mg | ORAL_TABLET | Freq: Four times a day (QID) | ORAL | Status: DC | PRN

## 2012-02-23 MED ORDER — CELECOXIB 200 MG PO CAPS: 200.0000 mg | ORAL_CAPSULE | Freq: Two times a day (BID) | ORAL | Status: AC

## 2012-02-23 MED ORDER — POLYETHYLENE GLYCOL 3350 17 G PO PACK: 17.0000 g | PACK | Freq: Two times a day (BID) | ORAL | Status: AC

## 2012-02-23 MED ORDER — ASPIRIN EC 325 MG PO TBEC: 325.0000 mg | DELAYED_RELEASE_TABLET | Freq: Two times a day (BID) | ORAL | Status: AC

## 2012-02-23 MED ORDER — METHOCARBAMOL 500 MG PO TABS: 500.0000 mg | ORAL_TABLET | Freq: Four times a day (QID) | ORAL | Status: AC | PRN

## 2012-02-23 MED ORDER — DIPHENHYDRAMINE HCL 25 MG PO CAPS: 25.0000 mg | ORAL_CAPSULE | Freq: Four times a day (QID) | ORAL | Status: DC | PRN

## 2012-02-23 MED ORDER — DSS 100 MG PO CAPS: 100.0000 mg | ORAL_CAPSULE | Freq: Two times a day (BID) | ORAL | Status: AC

## 2012-02-23 MED ORDER — HYDROCODONE-ACETAMINOPHEN 7.5-325 MG PO TABS: 1.0000 | ORAL_TABLET | ORAL | Status: AC | PRN

## 2012-02-23 MED ORDER — FERROUS SULFATE 325 (65 FE) MG PO TABS: 325.0000 mg | ORAL_TABLET | Freq: Three times a day (TID) | ORAL | Status: DC

## 2012-02-23 NOTE — Discharge Summary (Signed)
Physician Discharge Summary  Patient ID: Steven Tate MRN: [id number] DOB/AGE: 58-Nov-1957 58 y.o.  Admit date: 02/22/2012 Discharge date: 02/23/2012   Procedures:  Procedure(s) (LRB): TOTAL HIP ARTHROPLASTY ANTERIOR APPROACH (Right)  Attending Physician:  Dr. Durene Romans   Admission Diagnoses:   Right hip OA and pain  Discharge Diagnoses:  Principal Problem:  *S/P right THA, AA Active Problems:  Expected blood loss anemia   Arthritis GERD   HPI:  Pt is a 56 y.o. male complaining of hip pain for about 30 years, when he was diagnosed with a spur on the left hip. Five years ago he started having right hip pain. Pain had continually increased since the beginning, to the point where he has significant pain and almost no motion in the right hip and significantly decreased motion and pain in the left hip. Patient feels that the right hip is worse than the left hip at this time. X-rays in the clinic show end-stage arthritic changes of the bilateral hip. Pt has tried various conservative treatments which have failed to alleviate their symptoms. Various options are discussed with the patient. Risks, benefits and expectations were discussed with the patient. Patient understand the risks, benefits and expectations and wishes to proceed with surgery.  PCP: No primary provider on file.   Discharged Condition: good  Hospital Course:  Patient underwent the above stated procedure on 02/22/2012. Patient tolerated the procedure well and brought to the recovery room in good condition and subsequently to the floor.  POD #1 BP: 88/51 ; Pulse: 64 ; Temp: 97.9 F (36.6 C) ; Resp: 16  Pt's foley was removed, as well as the hemovac drain removed. IV was changed to a saline lock. Patient reports pain as mild, pain well controlled. No events throughout the night. Ready to be discharged home. Neurovascular intact, dorsiflexion/plantar flexion intact, incision: dressing C/D/I, no cellulitis present and  compartment soft.   LABS  Basename  02/23/12 0407   HGB  11.3  HCT  33.2    Discharge Exam: General appearance: alert, cooperative and no distress Extremities: Homans sign is negative, no sign of DVT, no edema, redness or tenderness in the calves or thighs and no ulcers, gangrene or trophic changes  Disposition:  Home or Self Care with follow up in 2 weeks   Follow-up Information    Follow up with Shelda Pal, MD in 2 weeks.   Contact information:   [facility] [address] [place] [address] [phone]          Discharge Orders    Future Orders Please Complete By Expires   Diet - low sodium heart healthy      Call MD / Call 911      Comments:   If you experience chest pain or shortness of breath, CALL 911 and be transported to the hospital emergency room.  If you develope a fever above 101 F, pus (white drainage) or increased drainage or redness at the wound, or calf pain, call your surgeon's office.   Discharge instructions      Comments:   Maintain surgical dressing for 8 days, then replace with gauze and tape. Keep the area dry and clean until follow up. Follow up in 2 weeks at [facility]. Call with any questions or concerns.   Constipation Prevention      Comments:   Drink plenty of fluids.  Prune juice may be helpful.  You may use a stool softener, such as  Colace (over the counter) 100 mg twice a day.  Use MiraLax (over the counter) for constipation as needed.   Increase activity slowly as tolerated      Change dressing      Comments:   Maintain surgical dressing for 8 days, then replace with 4x4 guaze and tape. Keep the area dry and clean.   TED hose      Comments:   Use stockings (TED hose) for 2 weeks on both leg(s).  You may remove them at night for sleeping.      Discharge Medication List as of 02/23/2012  1:33 PM    START taking these medications   Details  aspirin EC 325 MG tablet Take 1  tablet (325 mg total) by mouth 2 (two) times daily. X 4 weeks, Starting 02/23/2012, Until Sat 03/04/12, No Print    celecoxib (CELEBREX) 200 MG capsule Take 1 capsule (200 mg total) by mouth every 12 (twelve) hours., Starting 02/23/2012, Until Fri 03/24/12, No Print    diphenhydrAMINE (BENADRYL) 25 mg capsule Take 1 capsule (25 mg total) by mouth every 6 (six) hours as needed for itching, allergies or sleep., Starting 02/23/2012, Until Sat 03/04/12, No Print    docusate sodium 100 MG CAPS Take 100 mg by mouth 2 (two) times daily., Starting 02/23/2012, Until Sat 03/04/12, No Print    ferrous sulfate 325 (65 FE) MG tablet Take 1 tablet (325 mg total) by mouth 3 (three) times daily after meals., Starting 02/23/2012, Until Thu 02/22/13, No Print    HYDROcodone-acetaminophen (NORCO) 7.5-325 MG per tablet Take 1-2 tablets by mouth every 4 (four) hours as needed for pain., Starting 02/23/2012, Until Sat 03/04/12, Print    methocarbamol (ROBAXIN) 500 MG tablet Take 1 tablet (500 mg total) by mouth every 6 (six) hours as needed (muscle spasms)., Starting 02/23/2012, Until Sat 03/04/12, No Print    polyethylene glycol (MIRALAX / GLYCOLAX) packet Take 17 g by mouth 2 (two) times daily., Starting 02/23/2012, Until Sat 02/26/12, No Print      CONTINUE these medications which have NOT CHANGED   Details  omeprazole (PRILOSEC) 20 MG capsule Take 20 mg by mouth daily., Until Discontinued, Historical Med    testosterone enanthate (DELATESTRYL) 200 MG/ML injection Inject 200 mg into the muscle every 28 (twenty-eight) days. For IM use only, Until Discontinued, Historical Med    zolpidem (AMBIEN) 10 MG tablet Take 10 mg by mouth at bedtime as needed. Sleep, Until Discontinued, Historical Med      STOP taking these medications     meloxicam (MOBIC) 15 MG tablet Comments:  Reason for Stopping:           Signed: Anastasio Auerbach. Waris Rodger   PAC  02/23/2012, 4:46 PM

## 2012-02-23 NOTE — Care Management Note (Signed)
    Page 1 of 2   02/23/2012     1:19:22 PM   CARE MANAGEMENT NOTE 02/23/2012  Patient:  Steven Tate, Steven Tate   Account Number:  [id number]  Date Initiated:  02/23/2012  Documentation initiated by:  Colleen Can  Subjective/Objective Assessment:   dx osteoarthritis right hip; total hip replacemnt-anterior approach on day of admission     Action/Plan:   CM spoke with patient and spouse. Plans are for patient to return to his home in [place], [place] where spouse will be caregiver. Pt already has RW.   Anticipated DC Date:  02/23/2012   Anticipated DC Plan:  HOME W HOME HEALTH SERVICES  In-house referral  NA      DC Planning Services  CM consult      [facility] Choice  HOME HEALTH   Choice offered to / List presented to:  C-1 Patient   DME arranged  NA      DME agency  NA     HH arranged  HH-2 PT      HH agency  [facility]   Status of service:  Completed, signed off Medicare Important Message given?  NO (If response is "NO", the following Medicare IM given date fields will be blank) Date Medicare IM given:   Date Additional Medicare IM given:    Discharge Disposition:  HOME W HOME HEALTH SERVICES  Per UR Regulation:    If discussed at Long Length of Stay Meetings, dates discussed:    Comments:  02/23/2012 Raynelle Bring BSN CCM [phone] [facility] SERVICES WITH START DATE OF DAY AFTER DISCHARGE. ANTICIPATE DISCHARGE TODAY

## 2012-02-23 NOTE — Evaluation (Signed)
Occupational Therapy Evaluation Patient Details Name: Steven Tate MRN: [id number] DOB: 1955/06/29 Today's Date: 02/23/2012 Time: 1050-1107 OT Time Calculation (min): 17 min  OT Assessment / Plan / Recommendation Clinical Impression  All education completed with pt/wife. No follow up needs.     OT Assessment  Patient does not need any further OT services    Follow Up Recommendations  No OT follow up    Barriers to Discharge      Equipment Recommendations  None recommended by PT;None recommended by OT    Recommendations for Other Services    Frequency       Precautions / Restrictions Precautions Precautions: None Restrictions Weight Bearing Restrictions: No RLE Weight Bearing: Weight bearing as tolerated Other Position/Activity Restrictions: WBAT        ADL  Eating/Feeding: Simulated;Independent Where Assessed - Eating/Feeding: Chair Grooming: Simulated;Wash/dry hands;Set up Where Assessed - Grooming: Supported sitting Upper Body Bathing: Simulated;Chest;Right arm;Left arm;Abdomen;Set up Where Assessed - Upper Body Bathing: Unsupported sitting Lower Body Bathing: Simulated;Minimal assistance;Other (comment) (unable to reach feet) Where Assessed - Lower Body Bathing: Supported sit to stand Upper Body Dressing: Simulated;Set up Where Assessed - Upper Body Dressing: Unsupported sitting Lower Body Dressing: Simulated;Minimal assistance Where Assessed - Lower Body Dressing: Supported sit to stand Toilet Transfer: Performed;Min guard Acupuncturist: Raised toilet seat with arms (or 3-in-1 over toilet) Toileting - Clothing Manipulation and Hygiene: Simulated;Min guard Where Assessed - Engineer, mining and Hygiene: Standing Equipment Used: Rolling walker ADL Comments: Pt states his L hip will need to be replaced too so he currently isnt able to lean down and don/doff socks. He was using AE for LB ADL PTA and knows how to use equipment/didnt need  a review. Wife present for education. Pt has a walk in shower and a tub transfer bench for the bathtub. Discussed options as pt's shower is too small for 3in1 and discussed sitting for initial showers until alittle stronger. Pt agreeable to use transfer bench in tub and OT verball reviewed technique. Pt verbalized undestanding as well as his wife and they didnt feel they needed to practice.     OT Diagnosis:    OT Problem List:   OT Treatment Interventions:     OT Goals    Visit Information  Last OT Received On: 02/23/12 Assistance Needed: +1    Subjective Data  Subjective: I am pretty good Patient Stated Goal: home when able   Prior Functioning  Vision/Perception  Home Living Lives With: Spouse Available Help at Discharge: Family Type of Home: House Home Access: Stairs to enter Secretary/administrator of Steps: 3 Entrance Stairs-Rails: Right;Left Home Layout: One level Bathroom Shower/Tub: Tub/shower unit;Walk-in shower Bathroom Toilet: Standard Home Adaptive Equipment: Bedside commode/3-in-1;Raised toilet seat with rails;Reacher;Long-handled sponge;Sock aid;Tub transfer bench;Long-handled shoehorn Prior Function Level of Independence:  (pt able to perform ADL with AE independently) Able to Take Stairs?: Yes Driving: Yes Vocation: Retired Musician: No difficulties      Cognition  Overall Cognitive Status: Appears within functional limits for tasks assessed/performed Arousal/Alertness: Awake/alert Orientation Level: Appears intact for tasks assessed Behavior During Session: [facility] for tasks performed    Extremity/Trunk Assessment Right Upper Extremity Assessment RUE ROM/Strength/Tone: [facility] for tasks assessed Left Upper Extremity Assessment LUE ROM/Strength/Tone: WFL for tasks assessed Right Lower Extremity Assessment RLE ROM/Strength/Tone: Deficits RLE ROM/Strength/Tone Deficits: hip flex 80, abd10, hip strength 2+/5 Left Lower Extremity  Assessment LLE ROM/Strength/Tone: Deficits LLE ROM/Strength/Tone Deficits: hip flex to 80, abd 15, LE strength 4+/5  Mobility  Shoulder Instructions  Bed Mobility Bed Mobility: Supine to Sit Supine to Sit: 4: Min assist Details for Bed Mobility Assistance: cues for use of L LE to self assist Transfers Sit to Stand: 4: Min assist Stand to Sit: 4: Min assist Details for Transfer Assistance: cues for LE management and use of UEs to self assist          Balance     End of Session OT - End of Session Activity Tolerance: Patient tolerated treatment well Patient left: in chair;with call bell/phone within reach;with family/visitor present  GO     Lennox Laity [phone] 02/23/2012, 12:31 PM

## 2012-02-23 NOTE — Progress Notes (Signed)
Physical Therapy Treatment Patient Details Name: Steven Tate MRN: [id number] DOB: 1955-10-30 Today's Date: 02/23/2012 Time: 8469-6295 PT Time Calculation (min): 20 min  PT Assessment / Plan / Recommendation Comments on Treatment Session  reviewed car transfers with pt and spouse    Follow Up Recommendations  Home health PT    Barriers to Discharge        Equipment Recommendations  None recommended by PT;None recommended by OT    Recommendations for Other Services OT consult  Frequency 7X/week   Plan Discharge plan remains appropriate    Precautions / Restrictions Precautions Precautions: None Restrictions Weight Bearing Restrictions: No RLE Weight Bearing: Weight bearing as tolerated   Pertinent Vitals/Pain Min c/o pain    Mobility  Transfers Transfers: Stand to Sit;Sit to Stand Sit to Stand: 4: Min guard Stand to Sit: 5: Supervision Details for Transfer Assistance: cues for LE management and use of UEs to self assist Ambulation/Gait Ambulation/Gait Assistance: 4: Min guard;5: Supervision Ambulation Distance (Feet): 300 Feet Assistive device: Rolling walker Ambulation/Gait Assistance Details: min cues for posture and position from RW Gait Pattern: Step-through pattern Stairs: Yes Stairs Assistance: 4: Min assist Stairs Assistance Details (indicate cue type and reason): cues for sequence, foot/cane placement Stair Management Technique: One rail Left;Forwards;With cane;Step to pattern Number of Stairs: 4  (twice with spouse assisting on second attempt)    Exercises     PT Diagnosis:    PT Problem List:   PT Treatment Interventions:     PT Goals Acute Rehab PT Goals PT Goal Formulation: With patient Time For Goal Achievement: 02/28/12 Potential to Achieve Goals: Good Pt will go Supine/Side to Sit: with supervision Pt will go Sit to Supine/Side: with supervision Pt will go Sit to Stand: with supervision PT Goal: Sit to Stand - Progress: Progressing  toward goal Pt will go Stand to Sit: with supervision PT Goal: Stand to Sit - Progress: Progressing toward goal Pt will Ambulate: 51 - 150 feet;with supervision;with rolling walker PT Goal: Ambulate - Progress: Progressing toward goal Pt will Go Up / Down Stairs: 3-5 stairs;with min assist;with least restrictive assistive device PT Goal: Up/Down Stairs - Progress: Progressing toward goal  Visit Information  Last PT Received On: 02/23/12 Assistance Needed: +1    Subjective Data  Subjective: I can't beleive how much this doesn't hurt Patient Stated Goal: Resume previous lifestyle with decreased pain   Cognition  Overall Cognitive Status: Appears within functional limits for tasks assessed/performed Arousal/Alertness: Awake/alert Orientation Level: Appears intact for tasks assessed Behavior During Session: [facility] for tasks performed    Balance     End of Session PT - End of Session Equipment Utilized During Treatment: Gait belt Activity Tolerance: Patient tolerated treatment well Patient left: in chair;with call bell/phone within reach;with family/visitor present Nurse Communication: Mobility status   GP     Steven Tate 02/23/2012, 4:42 PM

## 2012-02-23 NOTE — Progress Notes (Signed)
Physical Therapy Treatment Patient Details Name: CRAIGORY TOSTE MRN: [id number] DOB: 1955-11-29 Today's Date: 02/23/2012 Time: 4098-1191 PT Time Calculation (min): 35 min  PT Assessment / Plan / Recommendation Comments on Treatment Session       Follow Up Recommendations  Home health PT    Barriers to Discharge        Equipment Recommendations  None recommended by PT    Recommendations for Other Services OT consult  Frequency 7X/week   Plan      Precautions / Restrictions Precautions Precautions: None Restrictions Weight Bearing Restrictions: No RLE Weight Bearing: Weight bearing as tolerated Other Position/Activity Restrictions: WBAT   Pertinent Vitals/Pain 2/10; premedicated    Mobility  Bed Mobility Bed Mobility: Supine to Sit Supine to Sit: 4: Min assist Details for Bed Mobility Assistance: cues for use of L LE to self assist Transfers Transfers: Stand to Sit;Sit to Stand Sit to Stand: 4: Min assist Stand to Sit: 4: Min assist Details for Transfer Assistance: cues for LE management and use of UEs to self assist Ambulation/Gait Ambulation/Gait Assistance: 4: Min assist Ambulation Distance (Feet): 150 Feet Assistive device: Rolling walker Ambulation/Gait Assistance Details: cues for posture, sequence and position from RW Gait Pattern: Step-to pattern;Step-through pattern    Exercises Total Joint Exercises Ankle Circles/Pumps: AROM;10 reps;Supine;Both Quad Sets: AROM;10 reps;Supine;Both Heel Slides: AAROM;Left;15 reps;Supine Hip ABduction/ADduction: 15 reps;Left;Supine;AAROM   PT Diagnosis: Difficulty walking  PT Problem List: Decreased strength;Decreased range of motion;Decreased mobility;Decreased activity tolerance;Decreased knowledge of use of DME;Pain PT Treatment Interventions: DME instruction;Gait training;Stair training;Functional mobility training;Therapeutic activities;Therapeutic exercise;Patient/family education   PT Goals Acute Rehab PT  Goals PT Goal Formulation: With patient Time For Goal Achievement: 02/28/12 Potential to Achieve Goals: Good Pt will go Supine/Side to Sit: with supervision PT Goal: Supine/Side to Sit - Progress: Goal set today Pt will go Sit to Supine/Side: with supervision PT Goal: Sit to Supine/Side - Progress: Goal set today Pt will go Sit to Stand: with supervision PT Goal: Sit to Stand - Progress: Goal set today Pt will go Stand to Sit: with supervision PT Goal: Stand to Sit - Progress: Goal set today Pt will Ambulate: 51 - 150 feet;with supervision;with rolling walker PT Goal: Ambulate - Progress: Goal set today Pt will Go Up / Down Stairs: 3-5 stairs;with min assist;with least restrictive assistive device PT Goal: Up/Down Stairs - Progress: Progressing toward goal  Visit Information  Last PT Received On: 02/23/12 Assistance Needed: +1    Subjective Data  Subjective: I can't beleive how much this doesn't hurt Patient Stated Goal: Resume previous lifestyle with decreased pain   Cognition  Overall Cognitive Status: Appears within functional limits for tasks assessed/performed Arousal/Alertness: Awake/alert Orientation Level: Appears intact for tasks assessed Behavior During Session: [facility] for tasks performed    Balance     End of Session PT - End of Session Activity Tolerance: Patient tolerated treatment well Patient left: in chair;with call bell/phone within reach;with family/visitor present Nurse Communication: Mobility status   GP     Venesha Petraitis 02/23/2012, 12:29 PM

## 2012-02-23 NOTE — Progress Notes (Signed)
   Subjective: 1 Day Post-Op Procedure(s) (LRB): TOTAL HIP ARTHROPLASTY ANTERIOR APPROACH (Right)   Patient reports pain as mild, pain well controlled. No events throughout the night.   Objective:   VITALS:   Filed Vitals:   02/23/12  BP: 8851  Pulse: 64  Temp: 97.9 F (36.6 C)   Resp: 16    Neurovascular intact Dorsiflexion/Plantar flexion intact Incision: dressing C/D/I No cellulitis present Compartment soft  LABS  Basename 02/23/12 0407  HGB 11.3*  HCT 33.2*  WBC 10.0  PLT 139*     Basename 02/23/12 0407  NA 131*  K 4.0  BUN 11  CREATININE 0.84  GLUCOSE 119*     Assessment/Plan: 1 Day Post-Op Procedure(s) (LRB): TOTAL HIP ARTHROPLASTY ANTERIOR APPROACH (Right) Foley cath d/c'ed HV drain d/c'ed Advance diet Up with therapy D/C IV fluids Discharge home with home health if he does well with PT Follow up in 2 weeks at [facility].  Follow-up Information    Follow up with OLIN,Jasleen Riepe D in 2 weeks.   Contact information:   [facility] [address], [address] [place] [address] [phone]            ABLA  Treated with iron and will observe    Anastasio Auerbach. Lyanna Blystone   PAC  02/23/2012, 9:18 AM

## 2012-02-23 NOTE — Progress Notes (Signed)
Pt to d/c home. AVS reviewed. Pt capable of verbalizing medications and follow-up appointments. Remains hemodynamically stable. No signs and symptoms of distress. Educated pt to return to ER in the case of SOB, dizziness, or chest pain.   

## 2012-02-24 ENCOUNTER — Encounter (HOSPITAL_COMMUNITY): Payer: Self-pay | Admitting: Orthopedic Surgery

## 2012-06-06 ENCOUNTER — Ambulatory Visit: Payer: Self-pay | Admitting: General Practice

## 2012-08-28 ENCOUNTER — Encounter (HOSPITAL_COMMUNITY): Payer: Self-pay | Admitting: Pharmacy Technician

## 2012-08-30 ENCOUNTER — Encounter (HOSPITAL_COMMUNITY)
Admission: RE | Admit: 2012-08-30 | Discharge: 2012-08-30 | Disposition: A | Discharge status: Home / Self Care | Payer: BC Managed Care – PPO | Source: Ambulatory Visit | Attending: Orthopedic Surgery | Admitting: Orthopedic Surgery

## 2012-08-30 ENCOUNTER — Encounter (HOSPITAL_COMMUNITY): Payer: Self-pay

## 2012-08-30 DIAGNOSIS — Z01812 Encounter for preprocedural laboratory examination: Secondary | ICD-10-CM | POA: Insufficient documentation

## 2012-08-30 LAB — URINALYSIS, ROUTINE W REFLEX MICROSCOPIC
Glucose, UA: NEGATIVE mg/dL
Ketones, ur: NEGATIVE mg/dL
Leukocytes, UA: NEGATIVE
Nitrite: NEGATIVE
Protein, ur: NEGATIVE mg/dL
Urobilinogen, UA: 0.2 mg/dL (ref 0.0–1.0)

## 2012-08-30 LAB — CBC
Platelets: 172 10*3/uL (ref 150–400)
RBC: 5.09 MIL/uL (ref 4.22–5.81)
RDW: 13.1 % (ref 11.5–15.5)
WBC: 8.6 10*3/uL (ref 4.0–10.5)

## 2012-08-30 LAB — BASIC METABOLIC PANEL
CO2: 26 mEq/L (ref 19–32)
Calcium: 8.7 mg/dL (ref 8.4–10.5)
Creatinine, Ser: 1.01 mg/dL (ref 0.50–1.35)
GFR calc Af Amer: >90 mL/min (ref >90)
GFR calc non Af Amer: 80 mL/min — ABNORMAL LOW (ref >90)
Sodium: 136 mEq/L (ref 135–145)

## 2012-08-30 LAB — APTT: aPTT: 33 seconds (ref 24–37)

## 2012-08-30 LAB — PROTIME-INR
INR: 0.98 (ref 0.00–1.49)
Prothrombin Time: 12.9 seconds (ref 11.6–15.2)

## 2012-08-30 NOTE — H&P (Signed)
TOTAL HIP ADMISSION H&P  Patient is admitted for left total hip arthroplasty, anterior approach.  Subjective:  Chief Complaint:   Left hip OA / pain  HPI: Steven Tate, 57 y.o. male, has a history of pain and functional disability in the left hip(s) due to arthritis and patient has failed non-surgical conservative treatments for greater than 12 weeks to include NSAID's and/or analgesics and activity modification.  Onset of symptoms was gradual starting >10 years ago with gradually worsening course since that time.The patient noted prior procedures of the hip to include arthroplasty on the right hip in September of 2013.  Patient states that the right hip has been doing well and he is looking forward to getting the left hip done.  Patient currently rates pain in the left hip at 8 out of 10 with activity. Patient has worsening of pain with activity and weight bearing, trendelenberg gait, pain that interfers with activities of daily living and pain with passive range of motion. Patient has evidence of periarticular osteophytes and joint space narrowing by imaging studies. This condition presents safety issues increasing the risk of falls.  There is no current active infection.  Risks, benefits and expectations were discussed with the patient. Patient understand the risks, benefits and expectations and wishes to proceed with surgery.   D/C Plans:   Home with HHPT  Post-op Meds:   Rx given for ASA, Robaxin, Iron, Colace and MiraLax  Tranexamic Acid:   To be given  Decadron:    To be given  FYI:   Nothing to note   Patient Active Problem List   Diagnosis Date Noted  . Expected blood loss anemia 02/23/2012  . S/P right THA, AA 02/22/2012   Past Medical History  Diagnosis Date  . Pneumonia     hx of 2001  . Chronic kidney disease     hx of uti   . Arthritis     Past Surgical History  Procedure Laterality Date  . Back surgery    . Appendectomy    . Tonsillectomy    . Total hip  arthroplasty  02/22/2012    Procedure: TOTAL HIP ARTHROPLASTY ANTERIOR APPROACH;  Surgeon: Shelda Pal, MD;  Location: [facility];  Service: Orthopedics;  Laterality: Right;    No Known Allergies   History  Substance Use Topics  . Smoking status: Never Smoker   . Smokeless tobacco: Former Neurosurgeon    Types: Snuff    Quit date: 06/22/2007  . Alcohol Use: No       Review of Systems  Constitutional: Negative.   HENT: Positive for tinnitus.   Eyes: Negative.   Respiratory: Negative.   Cardiovascular: Negative.   Gastrointestinal: Negative.   Genitourinary: Negative.   Musculoskeletal: Positive for back pain and joint pain.  Skin: Negative.   Neurological: Negative.   Endo/Heme/Allergies: Negative.   Psychiatric/Behavioral: Negative.     Objective:  Physical Exam  Constitutional: He is oriented to person, place, and time. He appears well-developed and well-nourished.  HENT:  Head: Normocephalic and atraumatic.  Mouth/Throat: Oropharynx is clear and moist.  Eyes: Pupils are equal, round, and reactive to light.  Neck: Neck supple. No JVD present. No tracheal deviation present. No thyromegaly present.  Cardiovascular: Normal rate, regular rhythm, normal heart sounds and intact distal pulses.   Respiratory: Effort normal and breath sounds normal. No stridor. No respiratory distress. He has no wheezes.  GI: Soft. There is no tenderness. There is no guarding.  Musculoskeletal:  Left hip: He exhibits decreased range of motion, decreased strength, tenderness and bony tenderness. He exhibits no swelling, no deformity and no laceration.  Lymphadenopathy:    He has no cervical adenopathy.  Neurological: He is alert and oriented to person, place, and time.  Skin: Skin is warm and dry.  Psychiatric: He has a normal mood and affect.    Vital signs in last 24 hours: Temp:  [98.1 F (36.7 C)] 98.1 F (36.7 C) (03/12 1300) Pulse Rate:  [74] 74 (03/12 1300) Resp:  [16] 16 (03/12  1300) BP: (124)/(76) 124/76 mmHg (03/12 1300) SpO2:  [96 %] 96 % (03/12 1300) Weight:  [102.967 kg (227 lb)] 102.967 kg (227 lb) (03/12 1300)  Labs:   Estimated body mass index is 27.21 kg/(m^2) as calculated from the following:   Height as of 02/22/12: 6\' 2"  (1.88 m).   Weight as of 02/16/12: 96.163 kg (212 lb).   Imaging Review Plain radiographs demonstrate severe degenerative joint disease of the left hip(s). The bone quality appears to be good for age and reported activity level.  Assessment/Plan:  End stage arthritis, left hip(s)  The patient history, physical examination, clinical judgement of the provider and imaging studies are consistent with end stage degenerative joint disease of the left hip(s) and total hip arthroplasty is deemed medically necessary. The treatment options including medical management, injection therapy, arthroscopy and arthroplasty were discussed at length. The risks and benefits of total hip arthroplasty were presented and reviewed. The risks due to aseptic loosening, infection, stiffness, dislocation/subluxation,  thromboembolic complications and other imponderables were discussed.  The patient acknowledged the explanation, agreed to proceed with the plan and consent was signed. Patient is being admitted for inpatient treatment for surgery, pain control, PT, OT, prophylactic antibiotics, VTE prophylaxis, progressive ambulation and ADL's and discharge planning.The patient is planning to be discharged home with home health services.    Anastasio Auerbach Barri Neidlinger   PAC  08/30/2012, 5:41 PM

## 2012-08-30 NOTE — Patient Instructions (Signed)
YOUR SURGERY IS SCHEDULED AT [facility]  ON:  Tuesday  3.25  REPORT TO [facility] AT:  6:40 AM      PHONE # FOR SHORT STAY IS [phone]  DO NOT EAT OR DRINK ANYTHING AFTER MIDNIGHT THE NIGHT BEFORE YOUR SURGERY.  YOU MAY BRUSH YOUR TEETH, RINSE OUT YOUR MOUTH--BUT NO WATER, NO FOOD, NO CHEWING GUM, NO MINTS, NO CANDIES, NO CHEWING TOBACCO.  PLEASE TAKE THE FOLLOWING MEDICATIONS THE AM OF YOUR SURGERY WITH A FEW SIPS OF WATER:  NO MEDS TO TAKE  IF YOU USE INHALERS--USE YOUR INHALERS THE AM OF YOUR SURGERY AND BRING INHALERS TO THE HOSPITAL.      DO NOT BRING VALUABLES, MONEY, CREDIT CARDS.  DO NOT WEAR JEWELRY, MAKE-UP, NAIL POLISH AND NO METAL PINS OR CLIPS IN YOUR HAIR. CONTACT LENS, DENTURES / PARTIALS, GLASSES SHOULD NOT BE WORN TO SURGERY AND IN MOST CASES-HEARING AIDS WILL NEED TO BE REMOVED.  BRING YOUR GLASSES CASE, ANY EQUIPMENT NEEDED FOR YOUR CONTACT LENS. FOR PATIENTS ADMITTED TO THE HOSPITAL--CHECK OUT TIME THE DAY OF DISCHARGE IS 11:00 AM.  ALL INPATIENT ROOMS ARE PRIVATE - WITH BATHROOM, TELEPHONE, TELEVISION AND WIFI INTERNET.                                 PLEASE READ OVER ANY  FACT SHEETS THAT YOU WERE GIVEN: MRSA INFORMATION, BLOOD TRANSFUSION INFORMATION, INCENTIVE SPIROMETER INFORMATION. FAILURE TO FOLLOW THESE INSTRUCTIONS MAY RESULT IN THE CANCELLATION OF YOUR SURGERY.   PATIENT SIGNATURE_________________________________

## 2012-08-30 NOTE — Pre-Procedure Instructions (Signed)
PT BROUGHT EKG REPORT FROM 08/10/12 FROM DR. STRICKLAND - AND DR. Nilsa Nutting OFFICE FAXED PT'S MEDICAL CLEARANCE, OFFICE NOTE AND LABS FROM FEB 2014.  ALL REPORTS ARE ON THIS CHART.  CXR NOT NEEDED -NOT DONE - PER PT'S MEDICAL HX AND ANESTHESIOLOGIST'S GUIDELINES.

## 2012-09-12 ENCOUNTER — Inpatient Hospital Stay (HOSPITAL_COMMUNITY): Payer: BC Managed Care – PPO

## 2012-09-12 ENCOUNTER — Encounter (HOSPITAL_COMMUNITY): Payer: Self-pay | Admitting: Certified Registered"

## 2012-09-12 ENCOUNTER — Encounter (HOSPITAL_COMMUNITY)
Admission: RE | Disposition: A | Discharge status: Home Health | Payer: Self-pay | Source: Ambulatory Visit | Attending: Orthopedic Surgery

## 2012-09-12 ENCOUNTER — Inpatient Hospital Stay (HOSPITAL_COMMUNITY): Payer: BC Managed Care – PPO | Admitting: Certified Registered"

## 2012-09-12 ENCOUNTER — Inpatient Hospital Stay (HOSPITAL_COMMUNITY)
Admission: RE | Admit: 2012-09-12 | Discharge: 2012-09-13 | DRG: 818 | Disposition: A | Discharge status: Home Health | Payer: BC Managed Care – PPO | Source: Ambulatory Visit | Attending: Orthopedic Surgery | Admitting: Orthopedic Surgery

## 2012-09-12 ENCOUNTER — Encounter (HOSPITAL_COMMUNITY): Payer: Self-pay | Admitting: *Deleted

## 2012-09-12 DIAGNOSIS — Z96649 Presence of unspecified artificial hip joint: Secondary | ICD-10-CM

## 2012-09-12 DIAGNOSIS — Z01812 Encounter for preprocedural laboratory examination: Secondary | ICD-10-CM

## 2012-09-12 DIAGNOSIS — M169 Osteoarthritis of hip, unspecified: Principal | ICD-10-CM | POA: Diagnosis present

## 2012-09-12 DIAGNOSIS — E663 Overweight: Secondary | ICD-10-CM

## 2012-09-12 DIAGNOSIS — M1610 Unilateral primary osteoarthritis, unspecified hip: Principal | ICD-10-CM | POA: Diagnosis present

## 2012-09-12 DIAGNOSIS — Z9660 Presence of unspecified orthopedic joint implant: Secondary | ICD-10-CM | POA: Insufficient documentation

## 2012-09-12 DIAGNOSIS — D62 Acute posthemorrhagic anemia: Secondary | ICD-10-CM | POA: Diagnosis not present

## 2012-09-12 HISTORY — PX: TOTAL HIP ARTHROPLASTY: SHX124

## 2012-09-12 LAB — TYPE AND SCREEN: ABO/RH(D): A NEG

## 2012-09-12 SURGERY — ARTHROPLASTY, HIP, TOTAL, ANTERIOR APPROACH
Anesthesia: Spinal | Site: Hip | Laterality: Left | Wound class: Clean

## 2012-09-12 MED ORDER — ONDANSETRON HCL 4 MG PO TABS: 4.0000 mg | ORAL_TABLET | Freq: Four times a day (QID) | ORAL | Status: DC | PRN

## 2012-09-12 MED ORDER — RIVAROXABAN 10 MG PO TABS
10.0000 mg | ORAL_TABLET | Freq: Every day | ORAL | Status: DC
  Administered 2012-09-13: 10 mg via ORAL
  Filled 2012-09-12 (×2): qty 1

## 2012-09-12 MED ORDER — HYDROMORPHONE HCL PF 1 MG/ML IJ SOLN: 0.2500 mg | INTRAMUSCULAR | Status: DC | PRN

## 2012-09-12 MED ORDER — ZOLPIDEM TARTRATE 5 MG PO TABS: 5.0000 mg | ORAL_TABLET | Freq: Every evening | ORAL | Status: DC | PRN

## 2012-09-12 MED ORDER — LACTATED RINGERS IV SOLN: INTRAVENOUS | Status: DC

## 2012-09-12 MED ORDER — POLYETHYLENE GLYCOL 3350 17 G PO PACK: 17.0000 g | PACK | Freq: Every day | ORAL | Status: DC | PRN

## 2012-09-12 MED ORDER — LACTATED RINGERS IV SOLN
INTRAVENOUS | Status: DC
Start: 2012-09-12 — End: 2012-09-12
  Administered 2012-09-12: 1000 mL via INTRAVENOUS

## 2012-09-12 MED ORDER — 0.9 % SODIUM CHLORIDE (POUR BTL) OPTIME
TOPICAL | Status: DC | PRN
  Administered 2012-09-12: 1000 mL

## 2012-09-12 MED ORDER — TRANEXAMIC ACID 100 MG/ML IV SOLN
1000.0000 mg | Freq: Once | INTRAVENOUS | Status: AC
  Administered 2012-09-12: 1000 mg via INTRAVENOUS
  Filled 2012-09-12: qty 10

## 2012-09-12 MED ORDER — METHOCARBAMOL 500 MG PO TABS
500.0000 mg | ORAL_TABLET | Freq: Four times a day (QID) | ORAL | Status: DC | PRN
  Administered 2012-09-13: 500 mg via ORAL
  Filled 2012-09-12: qty 1

## 2012-09-12 MED ORDER — HYDROCODONE-ACETAMINOPHEN 7.5-325 MG PO TABS
1.0000 | ORAL_TABLET | ORAL | Status: DC
  Administered 2012-09-12 – 2012-09-13 (×6): 2 via ORAL
  Filled 2012-09-12 (×6): qty 2
  Filled 2012-09-12: qty 1

## 2012-09-12 MED ORDER — PHENYLEPHRINE HCL 10 MG/ML IJ SOLN
INTRAMUSCULAR | Status: DC | PRN
  Administered 2012-09-12 (×4): 40 ug via INTRAVENOUS

## 2012-09-12 MED ORDER — FERROUS SULFATE 325 (65 FE) MG PO TABS
325.0000 mg | ORAL_TABLET | Freq: Three times a day (TID) | ORAL | Status: DC
  Administered 2012-09-12 – 2012-09-13 (×2): 325 mg via ORAL
  Filled 2012-09-12 (×6): qty 1

## 2012-09-12 MED ORDER — SENNA 8.6 MG PO TABS
1.0000 | ORAL_TABLET | Freq: Two times a day (BID) | ORAL | Status: DC
  Administered 2012-09-12 – 2012-09-13 (×2): 8.6 mg via ORAL
  Filled 2012-09-12 (×2): qty 1

## 2012-09-12 MED ORDER — MENTHOL 3 MG MT LOZG: 1.0000 | LOZENGE | OROMUCOSAL | Status: DC | PRN

## 2012-09-12 MED ORDER — PHENOL 1.4 % MT LIQD: 1.0000 | OROMUCOSAL | Status: DC | PRN

## 2012-09-12 MED ORDER — ALUM & MAG HYDROXIDE-SIMETH 200-200-20 MG/5ML PO SUSP: 30.0000 mL | ORAL | Status: DC | PRN

## 2012-09-12 MED ORDER — BUPIVACAINE IN DEXTROSE 0.75-8.25 % IT SOLN
INTRATHECAL | Status: DC | PRN
  Administered 2012-09-12: 2 mL via INTRATHECAL

## 2012-09-12 MED ORDER — PROMETHAZINE HCL 25 MG/ML IJ SOLN: 6.2500 mg | INTRAMUSCULAR | Status: DC | PRN

## 2012-09-12 MED ORDER — DEXAMETHASONE SODIUM PHOSPHATE 10 MG/ML IJ SOLN
10.0000 mg | Freq: Once | INTRAMUSCULAR | Status: AC
  Administered 2012-09-12: 10 mg via INTRAVENOUS

## 2012-09-12 MED ORDER — DIPHENHYDRAMINE HCL 12.5 MG/5ML PO ELIX: 25.0000 mg | ORAL_SOLUTION | Freq: Four times a day (QID) | ORAL | Status: DC | PRN

## 2012-09-12 MED ORDER — CEFAZOLIN SODIUM-DEXTROSE 2-3 GM-% IV SOLR
2.0000 g | INTRAVENOUS | Status: AC
  Administered 2012-09-12: 2 g via INTRAVENOUS

## 2012-09-12 MED ORDER — CEFAZOLIN SODIUM-DEXTROSE 2-3 GM-% IV SOLR
2.0000 g | Freq: Four times a day (QID) | INTRAVENOUS | Status: AC
Start: 2012-09-12 — End: 2012-09-12
  Administered 2012-09-12 (×2): 2 g via INTRAVENOUS
  Filled 2012-09-12 (×2): qty 50

## 2012-09-12 MED ORDER — FENTANYL CITRATE 0.05 MG/ML IJ SOLN
INTRAMUSCULAR | Status: DC | PRN
  Administered 2012-09-12: 100 ug via INTRAVENOUS

## 2012-09-12 MED ORDER — STERILE WATER FOR IRRIGATION IR SOLN
Status: DC | PRN
  Administered 2012-09-12: 3000 mL

## 2012-09-12 MED ORDER — SODIUM CHLORIDE 0.9 % IV SOLN
INTRAVENOUS | Status: DC
  Administered 2012-09-12 (×2): via INTRAVENOUS
  Filled 2012-09-12 (×7): qty 1000

## 2012-09-12 MED ORDER — ONDANSETRON HCL 4 MG/2ML IJ SOLN: 4.0000 mg | Freq: Four times a day (QID) | INTRAMUSCULAR | Status: DC | PRN

## 2012-09-12 MED ORDER — HYDROMORPHONE HCL PF 1 MG/ML IJ SOLN
0.2000 mg | INTRAMUSCULAR | Status: DC | PRN
  Administered 2012-09-12: 0.5 mg via INTRAVENOUS
  Filled 2012-09-12: qty 1

## 2012-09-12 MED ORDER — METHOCARBAMOL 100 MG/ML IJ SOLN
500.0000 mg | Freq: Four times a day (QID) | INTRAVENOUS | Status: DC | PRN
  Filled 2012-09-12: qty 5

## 2012-09-12 MED ORDER — ACETAMINOPHEN 10 MG/ML IV SOLN
INTRAVENOUS | Status: DC | PRN
  Administered 2012-09-12: 1000 mg via INTRAVENOUS

## 2012-09-12 MED ORDER — CHLORHEXIDINE GLUCONATE 4 % EX LIQD: 60.0000 mL | Freq: Once | CUTANEOUS | Status: DC

## 2012-09-12 MED ORDER — ONDANSETRON HCL 4 MG/2ML IJ SOLN
INTRAMUSCULAR | Status: DC | PRN
  Administered 2012-09-12: 4 mg via INTRAVENOUS

## 2012-09-12 MED ORDER — DOCUSATE SODIUM 100 MG PO CAPS
100.0000 mg | ORAL_CAPSULE | Freq: Two times a day (BID) | ORAL | Status: DC
  Administered 2012-09-12 – 2012-09-13 (×2): 100 mg via ORAL

## 2012-09-12 MED ORDER — DEXAMETHASONE SODIUM PHOSPHATE 10 MG/ML IJ SOLN
10.0000 mg | Freq: Once | INTRAMUSCULAR | Status: AC
  Administered 2012-09-13: 10 mg via INTRAVENOUS
  Filled 2012-09-12 (×2): qty 1

## 2012-09-12 MED ORDER — PROPOFOL 10 MG/ML IV EMUL
INTRAVENOUS | Status: DC | PRN
  Administered 2012-09-12: 50 ug/kg/min via INTRAVENOUS

## 2012-09-12 MED ORDER — LACTATED RINGERS IV SOLN
INTRAVENOUS | Status: DC | PRN
  Administered 2012-09-12 (×3): via INTRAVENOUS

## 2012-09-12 MED ORDER — MIDAZOLAM HCL 5 MG/5ML IJ SOLN
INTRAMUSCULAR | Status: DC | PRN
  Administered 2012-09-12: 1 mg via INTRAVENOUS
  Administered 2012-09-12: 2 mg via INTRAVENOUS
  Administered 2012-09-12: 1 mg via INTRAVENOUS

## 2012-09-12 MED ORDER — EPHEDRINE SULFATE 50 MG/ML IJ SOLN
INTRAMUSCULAR | Status: DC | PRN
  Administered 2012-09-12 (×2): 5 mg via INTRAVENOUS

## 2012-09-12 SURGICAL SUPPLY — 37 items
BAG ZIPLOCK 12X15 (MISCELLANEOUS) ×4 IMPLANT
BLADE SAW SGTL 18X1.27X75 (BLADE) ×2 IMPLANT
CLOTH BEACON ORANGE TIMEOUT ST (SAFETY) ×2 IMPLANT
DERMABOND ADVANCED (GAUZE/BANDAGES/DRESSINGS) ×1
DERMABOND ADVANCED .7 DNX12 (GAUZE/BANDAGES/DRESSINGS) ×1 IMPLANT
DRAPE C-ARM 42X72 X-RAY (DRAPES) ×2 IMPLANT
DRAPE STERI IOBAN 125X83 (DRAPES) ×2 IMPLANT
DRAPE U-SHAPE 47X51 STRL (DRAPES) ×6 IMPLANT
DRSG AQUACEL AG ADV 3.5X10 (GAUZE/BANDAGES/DRESSINGS) ×2 IMPLANT
DRSG TEGADERM 4X4.75 (GAUZE/BANDAGES/DRESSINGS) ×2 IMPLANT
DURAPREP 26ML APPLICATOR (WOUND CARE) ×2 IMPLANT
ELECT BLADE TIP CTD 4 INCH (ELECTRODE) ×2 IMPLANT
ELECT REM PT RETURN 9FT ADLT (ELECTROSURGICAL) ×2
ELECTRODE REM PT RTRN 9FT ADLT (ELECTROSURGICAL) ×1 IMPLANT
EVACUATOR 1/8 PVC DRAIN (DRAIN) IMPLANT
FACESHIELD LNG OPTICON STERILE (SAFETY) ×8 IMPLANT
GAUZE SPONGE 2X2 8PLY STRL LF (GAUZE/BANDAGES/DRESSINGS) ×1 IMPLANT
GLOVE BIOGEL PI IND STRL 7.5 (GLOVE) ×1 IMPLANT
GLOVE BIOGEL PI IND STRL 8 (GLOVE) ×1 IMPLANT
GLOVE BIOGEL PI INDICATOR 7.5 (GLOVE) ×1
GLOVE BIOGEL PI INDICATOR 8 (GLOVE) ×1
GLOVE ECLIPSE 8.0 STRL XLNG CF (GLOVE) ×2 IMPLANT
GLOVE ORTHO TXT STRL SZ7.5 (GLOVE) ×4 IMPLANT
GOWN BRE IMP PREV XXLGXLNG (GOWN DISPOSABLE) ×2 IMPLANT
GOWN STRL NON-REIN LRG LVL3 (GOWN DISPOSABLE) ×2 IMPLANT
KIT BASIN OR (CUSTOM PROCEDURE TRAY) ×2 IMPLANT
PACK TOTAL JOINT (CUSTOM PROCEDURE TRAY) ×2 IMPLANT
PADDING CAST COTTON 6X4 STRL (CAST SUPPLIES) ×2 IMPLANT
SPONGE GAUZE 2X2 STER 10/PKG (GAUZE/BANDAGES/DRESSINGS) ×1
SUCTION FRAZIER 12FR DISP (SUCTIONS) ×2 IMPLANT
SUT MNCRL AB 4-0 PS2 18 (SUTURE) ×2 IMPLANT
SUT VIC AB 1 CT1 36 (SUTURE) ×6 IMPLANT
SUT VIC AB 2-0 CT1 27 (SUTURE) ×2
SUT VIC AB 2-0 CT1 TAPERPNT 27 (SUTURE) ×2 IMPLANT
SUT VLOC 180 0 24IN GS25 (SUTURE) ×2 IMPLANT
TOWEL OR 17X26 10 PK STRL BLUE (TOWEL DISPOSABLE) ×4 IMPLANT
TRAY FOLEY CATH 14FRSI W/METER (CATHETERS) ×2 IMPLANT

## 2012-09-12 NOTE — Plan of Care (Signed)
Problem: Consults Goal: Diagnosis- Total Joint Replacement Left anterior hip     

## 2012-09-12 NOTE — Transfer of Care (Signed)
Immediate Anesthesia Transfer of Care Note  Patient: Steven Tate  Procedure(s) Performed: Procedure(s): TOTAL HIP ARTHROPLASTY ANTERIOR APPROACH (Left)  Patient Location: PACU  Anesthesia Type:Spinal  Level of Consciousness: awake, alert  and oriented  Airway & Oxygen Therapy: Patient Spontanous Breathing and Patient connected to face mask oxygen  Post-op Assessment: Report given to PACU RN and Post -op Vital signs reviewed and stable  Post vital signs: Reviewed and stable  Complications: No apparent anesthesia complications

## 2012-09-12 NOTE — Care Management Note (Unsigned)
    Page 1 of 1   09/12/2012     6:38:38 PM   CARE MANAGEMENT NOTE 09/12/2012  Patient:  RENAN, DANESE   Account Number:  [id number]  Date Initiated:  09/12/2012  Documentation initiated by:  Colleen Can  Subjective/Objective Assessment:   dx total hip replacemnt-anterior approach     Action/Plan:   CM spoke with patient and spouse. Plans are for patient to return to his home in [place] where spouse will be caregiver. He already has DME- RW and [facility] and wishes to use [facility] services   Anticipated DC Date:  09/13/2012   Anticipated DC Plan:  HOME W HOME HEALTH SERVICES      DC Planning Services  CM consult      [place] Choice  HOME HEALTH   Choice offered to / List presented to:             Status of service:  In process, will continue to follow Medicare Important Message given?   (If response is "NO", the following Medicare IM given date fields will be blank) Date Medicare IM given:   Date Additional Medicare IM given:    Discharge Disposition:    Per UR Regulation:  Reviewed for med. necessity/level of care/duration of stay  If discussed at Long Length of Stay Meetings, dates discussed:    Comments:

## 2012-09-12 NOTE — Anesthesia Procedure Notes (Signed)
Spinal  Patient location during procedure: OR End time: 09/12/2012 9:40 AM Staffing CRNA/Resident: Enriqueta Shutter Performed by: anesthesiologist and resident/CRNA  Preanesthetic Checklist Completed: patient identified, site marked, surgical consent, pre-op evaluation, timeout performed, IV checked, risks and benefits discussed and monitors and equipment checked Spinal Block Patient position: sitting Prep: Betadine Patient monitoring: heart rate, continuous pulse ox and blood pressure Approach: midline Location: L3-4 Injection technique: single-shot Needle Needle type: Pencil-Tip and Sprotte  Needle gauge: 24 G Needle length: 9 cm Assessment Sensory level: T6 Additional Notes Expiration date of kit checked and confirmed. Patient tolerated procedure well, without complications.

## 2012-09-12 NOTE — Op Note (Signed)
NAME:  KHAREE LESESNE                ACCOUNT NO.: [id number]      MEDICAL RECORD NO.: [id number]      FACILITY:  [facility]      PHYSICIAN:  Durene Romans D  DATE OF BIRTH:  June 05, 1956     DATE OF PROCEDURE:  09/12/2012                                 OPERATIVE REPORT         PREOPERATIVE DIAGNOSIS: Left  hip osteoarthritis.      POSTOPERATIVE DIAGNOSIS:  Left hip osteoarthritis.      PROCEDURE:  Left total hip replacement through an anterior approach   utilizing DePuy THR system, component size 56mm pinnacle cup, a size 36+4 neutral   Altrex liner, a size 6 Hi Tri Lock stem with a 36+5 delta ceramic   ball.      SURGEON:  Madlyn Frankel. Charlann Boxer, M.D.      ASSISTANT:  Leilani Able, PA-C      ANESTHESIA:  General.      SPECIMENS:  None.      COMPLICATIONS:  None.      BLOOD LOSS:  600 cc     DRAINS:  One Hemovac.      INDICATION OF THE PROCEDURE:  JONOVAN BOEDECKER is a 57 y.o. male who had   presented to office for evaluation of left hip pain.  Radiographs revealed   progressive degenerative changes with bone-on-bone   articulation to the  hip joint.  The patient had painful limited range of   motion significantly affecting their overall quality of life.  The patient was failing to    respond to conservative measures, and at this point was ready   to proceed with more definitive measures.  The patient has noted progressive   degenerative changes in his hip, progressive problems and dysfunction   with regarding the hip prior to surgery.  Consent was obtained for   benefit of pain relief.  Specific risk of infection, DVT, component   failure, dislocation, need for revision surgery, as well discussion of   the anterior versus posterior approach were reviewed.  Consent was   obtained for benefit of anterior pain relief through an anterior   approach.      PROCEDURE IN DETAIL:  The patient was brought to operative theater.   Once adequate anesthesia,  preoperative antibiotics, 2gm Ancef administered.   The patient was positioned supine on the OSI Hanna table.  Once adequate   padding of boney process was carried out, we had predraped out the hip, and  used fluoroscopy to confirm orientation of the pelvis and position.      The left hip was then prepped and draped from proximal iliac crest to   mid thigh with shower curtain technique.      Time-out was performed identifying the patient, planned procedure, and   extremity.     An incision was then made 2 cm distal and lateral to the   anterior superior iliac spine extending over the orientation of the   tensor fascia lata muscle and sharp dissection was carried down to the   fascia of the muscle and protractor placed in the soft tissues.      The fascia was then incised.  The muscle belly was identified and  swept   laterally and retractor placed along the superior neck.  Following   cauterization of the circumflex vessels and removing some pericapsular   fat, a second cobra retractor was placed on the inferior neck.  A third   retractor was placed on the anterior acetabulum after elevating the   anterior rectus.  A L-capsulotomy was along the line of the   superior neck to the trochanteric fossa, then extended proximally and   distally.  Tag sutures were placed and the retractors were then placed   intracapsular.  We then identified the trochanteric fossa and   orientation of my neck cut, confirmed this radiographically   and then made a neck osteotomy with the femur on traction.  The femoral   head was removed without difficulty or complication.  Traction was let   off and retractors were placed posterior and anterior around the   acetabulum.      The labrum and foveal tissue were debrided.  I began reaming with a 48mm   reamer and reamed up to 55mm reamer with good bony bed preparation and a 56   cup was chosen.  The final 56mm Pinnacle cup was then impacted under fluoroscopy  to  confirm the depth of penetration and orientation with respect to   abduction.  A screw was placed followed by the hole eliminator.  The final   36+4 neutral Altrex liner was impacted with good visualized rim fit.  The cup was positioned anatomically within the acetabular portion of the pelvis.      At this point, the femur was rolled at 80 degrees.  Further capsule was   released off the inferior aspect of the femoral neck.  I then   released the superior capsule proximally.  The hook was placed laterally   along the femur and elevated manually and held in position with the bed   hook.  The leg was then extended and adducted with the leg rolled to 100   degrees of external rotation.  Once the proximal femur was fully   exposed, I used a box osteotome to set orientation.  I then began   broaching with the starting chili pepper broach and passed this by hand and then broached up to 6 to match the contralateral hip.  With the 6 broach in place I chose a high offset neck and did a trial reduction with a 36+5 ball to match the other hip.  The offset was appropriate, leg lengths   appeared to be equal, confirmed radiographically.   Given these findings, I went ahead and dislocated the hip, repositioned all   retractors and positioned the right hip in the extended and abducted position.  The final 6 hi Tri Lock stem was   chosen and it was impacted down to the level of neck cut.  Based on this   and the trial reduction, a 36+5 delta ceramic ball was chosen and   impacted onto a clean and dry trunnion, and the hip was reduced.  The   hip had been irrigated throughout the case again at this point.  I did   reapproximate the superior capsular leaflet to the anterior leaflet   using #1 Vicryl, placed a medium Hemovac drain deep.  The fascia of the   tensor fascia lata muscle was then reapproximated using #1 Vicryl.  The   remaining wound was closed with 2-0 Vicryl and running 4-0 Monocryl.   The hip was  cleaned, dried, and  dressed sterilely using Dermabond and   Aquacel dressing.  Drain site dressed separately.  She was then brought   to recovery room in stable condition tolerating the procedure well.    Leilani Able, PA-C was present for the entirety of the case involved from   preoperative positioning, perioperative retractor management, general   facilitation of the case, as well as primary wound closure as assistant.            Madlyn Frankel Charlann Boxer, M.D.            MDO/MEDQ  D:  04/13/2011  T:  04/13/2011  Job:  [id number]      Electronically Signed by Durene Romans M.D. on 04/19/2011 09:15:38 AM

## 2012-09-12 NOTE — Interval H&P Note (Signed)
History and Physical Interval Note:  09/12/2012 6:53 AM  Steven Tate  has presented today for surgery, with the diagnosis of LEFT HIP OA  The various methods of treatment have been discussed with the patient and family. After consideration of risks, benefits and other options for treatment, the patient has consented to  Procedure(s): TOTAL HIP ARTHROPLASTY ANTERIOR APPROACH (Left) as a surgical intervention .  The patient's history has been reviewed, patient examined, no change in status, stable for surgery.  I have reviewed the patient's chart and labs.  Questions were answered to the patient's satisfaction.     Shelda Pal

## 2012-09-12 NOTE — Progress Notes (Signed)
Received orders for rolling walker and commode.  Patients stated that he already has both pieces at home.  No DME needs at this time.

## 2012-09-12 NOTE — Evaluation (Signed)
Physical Therapy Evaluation Patient Details Name: KEES IDROVO MRN: [id number] DOB: [birth year] Today's Date: 09/12/2012 Time: 1600-1630 PT Time Calculation (min): 30 min  PT Assessment / Plan / Recommendation Clinical Impression  Pt s/p L THR presents with decreased L LE strength/ROM (and to lesser extent R LE ROM)  and post op pain limiting functional mobility    PT Assessment  Patient needs continued PT services    Follow Up Recommendations  Home health PT    Does the patient have the potential to tolerate intense rehabilitation      Barriers to Discharge None      Equipment Recommendations  None recommended by PT    Recommendations for Other Services     Frequency 7X/week    Precautions / Restrictions Precautions Precautions: Fall Restrictions Weight Bearing Restrictions: No Other Position/Activity Restrictions: WBAT   Pertinent Vitals/Pain 2/10; premed, ice packs provided      Mobility  Bed Mobility Bed Mobility: Supine to Sit Supine to Sit: 4: Min assist Details for Bed Mobility Assistance: cues for sequence and use of R LE to self assist Transfers Transfers: Sit to Stand;Stand to Sit Sit to Stand: 4: Min assist Stand to Sit: 4: Min assist Details for Transfer Assistance: cues for LE management and use of UEs to self assist Ambulation/Gait Ambulation/Gait Assistance: 4: Min assist Ambulation Distance (Feet): 100 Feet (twice) Assistive device: Rolling walker Ambulation/Gait Assistance Details: min cues for posture and position from RW Gait Pattern: Step-to pattern;Decreased step length - right;Decreased step length - left;Step-through pattern    Exercises Total Joint Exercises Ankle Circles/Pumps: AROM;10 reps;Both;Supine Quad Sets: AROM;Both;10 reps;Supine Heel Slides: AAROM;15 reps;Supine;Left Hip ABduction/ADduction: AAROM;10 reps;Left;Supine   PT Diagnosis: Difficulty walking  PT Problem List: Decreased strength;Decreased range of  motion;Decreased activity tolerance;Decreased mobility;Decreased knowledge of use of DME;Pain PT Treatment Interventions: DME instruction;Gait training;Stair training;Functional mobility training;Therapeutic activities;Therapeutic exercise;Patient/family education   PT Goals Acute Rehab PT Goals PT Goal Formulation: With patient Time For Goal Achievement: 09/12/12 Potential to Achieve Goals: Good Pt will go Supine/Side to Sit: with supervision PT Goal: Supine/Side to Sit - Progress: Goal set today Pt will go Sit to Supine/Side: with supervision PT Goal: Sit to Supine/Side - Progress: Goal set today Pt will go Sit to Stand: with supervision PT Goal: Sit to Stand - Progress: Goal set today Pt will go Stand to Sit: with supervision PT Goal: Stand to Sit - Progress: Goal set today Pt will Ambulate: >150 feet;with supervision;with rolling walker PT Goal: Ambulate - Progress: Goal set today Pt will Go Up / Down Stairs: 3-5 stairs;with min assist;with least restrictive assistive device PT Goal: Up/Down Stairs - Progress: Goal set today  Visit Information  Last PT Received On: 09/12/12 Assistance Needed: +1    Subjective Data  Subjective: I did really well with my first one and hope I do just as well with this one Patient Stated Goal: Resume previous lifestyle with decreased pain   Prior Functioning  Home Living Lives With: Spouse Available Help at Discharge: Family Type of Home: House Home Access: Stairs to enter Secretary/administrator of Steps: 3 Entrance Stairs-Rails: Right;Left Home Layout: Able to live on main level with bedroom/bathroom Home Adaptive Equipment: Walker - rolling;Straight cane Prior Function Level of Independence: Independent Able to Take Stairs?: Yes Driving: Yes Vocation: Retired Musician: No difficulties    Copywriter, advertising Overall Cognitive Status: Appears within functional limits for tasks assessed/performed Arousal/Alertness:  Awake/alert Orientation Level: Appears intact for tasks assessed Behavior During  Session: [facility] for tasks performed    Extremity/Trunk Assessment Right Upper Extremity Assessment RUE ROM/Strength/Tone: [facility] for tasks assessed Left Upper Extremity Assessment LUE ROM/Strength/Tone: [facility] for tasks assessed Right Lower Extremity Assessment RLE ROM/Strength/Tone: Deficits RLE ROM/Strength/Tone Deficits: AAROM to 80 hip flex and 25 abd; strength WFL Left Lower Extremity Assessment LLE ROM/Strength/Tone: Deficits LLE ROM/Strength/Tone Deficits: AAROM at hip to 70 flex and 10 abd with strength 2/5   Balance    End of Session PT - End of Session Equipment Utilized During Treatment: Gait belt Activity Tolerance: Patient tolerated treatment well Patient left: in chair;with call bell/phone within reach;with family/visitor present Nurse Communication: Mobility status  GP     Keishawna Carranza 09/12/2012, 4:42 PM

## 2012-09-12 NOTE — Anesthesia Preprocedure Evaluation (Signed)
Anesthesia Evaluation  Patient identified by MRN, date of birth, ID band Patient awake    Reviewed: Allergy & Precautions, H&P , NPO status , Patient's Chart, lab work & pertinent test results  Airway Mallampati: I TM Distance: >3 FB Neck ROM: Full    Dental  (+) Teeth Intact and Dental Advisory Given   Pulmonary neg pulmonary ROS, pneumonia -, resolved,  breath sounds clear to auscultation- rhonchi  Pulmonary exam normal       Cardiovascular Exercise Tolerance: Good negative cardio ROS  Rhythm:Regular Rate:Normal     Neuro/Psych negative neurological ROS  negative psych ROS   GI/Hepatic negative GI ROS, Neg liver ROS, PPI for ulcer prophylaxis with NSAIDs   Endo/Other  negative endocrine ROS  Renal/GU Renal diseasenegative Renal ROS  negative genitourinary   Musculoskeletal negative musculoskeletal ROS (+)   Abdominal   Peds negative pediatric ROS (+)  Hematology negative hematology ROS (+)   Anesthesia Other Findings   Reproductive/Obstetrics                           Anesthesia Physical Anesthesia Plan  ASA: I  Anesthesia Plan: Spinal   Post-op Pain Management:    Induction:   Airway Management Planned: Simple Face Mask  Additional Equipment:   Intra-op Plan:   Post-operative Plan:   Informed Consent: I have reviewed the patients History and Physical, chart, labs and discussed the procedure including the risks, benefits and alternatives for the proposed anesthesia with the patient or authorized representative who has indicated his/her understanding and acceptance.   Dental advisory given  Plan Discussed with: CRNA  Anesthesia Plan Comments:         Anesthesia Quick Evaluation

## 2012-09-13 ENCOUNTER — Encounter (HOSPITAL_COMMUNITY): Payer: Self-pay | Admitting: Orthopedic Surgery

## 2012-09-13 DIAGNOSIS — E663 Overweight: Secondary | ICD-10-CM

## 2012-09-13 LAB — BASIC METABOLIC PANEL
BUN: 9 mg/dL (ref 6–23)
Calcium: 8.2 mg/dL — ABNORMAL LOW (ref 8.4–10.5)
GFR calc non Af Amer: >90 mL/min (ref >90)
Glucose, Bld: 136 mg/dL — ABNORMAL HIGH (ref 70–99)

## 2012-09-13 LAB — CBC
HCT: 33.9 % — ABNORMAL LOW (ref 39.0–52.0)
Hemoglobin: 11.8 g/dL — ABNORMAL LOW (ref 13.0–17.0)
MCH: 29.7 pg (ref 26.0–34.0)
MCHC: 34.8 g/dL (ref 30.0–36.0)
RDW: 13 % (ref 11.5–15.5)

## 2012-09-13 MED ORDER — METHOCARBAMOL 500 MG PO TABS: 500.0000 mg | ORAL_TABLET | Freq: Four times a day (QID) | ORAL | Status: DC | PRN

## 2012-09-13 MED ORDER — FERROUS SULFATE 325 (65 FE) MG PO TABS: 325.0000 mg | ORAL_TABLET | Freq: Three times a day (TID) | ORAL | Status: DC

## 2012-09-13 MED ORDER — HYDROCODONE-ACETAMINOPHEN 7.5-325 MG PO TABS: 1.0000 | ORAL_TABLET | ORAL | Status: DC | PRN

## 2012-09-13 MED ORDER — POLYETHYLENE GLYCOL 3350 17 G PO PACK: 17.0000 g | PACK | Freq: Every day | ORAL | Status: DC | PRN

## 2012-09-13 MED ORDER — DSS 100 MG PO CAPS: 100.0000 mg | ORAL_CAPSULE | Freq: Two times a day (BID) | ORAL | Status: DC

## 2012-09-13 MED ORDER — ASPIRIN EC 325 MG PO TBEC: 325.0000 mg | DELAYED_RELEASE_TABLET | Freq: Two times a day (BID) | ORAL | Status: DC

## 2012-09-13 NOTE — Progress Notes (Signed)
Pt to d/c home with [facility]. Pt has all recommended DME. AVS reviewed and "My Chart" discussed with pt. Pt capable of verbalizing medications and follow-up appointments. Remains hemodynamically stable. No signs and symptoms of distress. Educated pt to return to ER in the case of SOB, dizziness, or chest pain.  

## 2012-09-13 NOTE — Discharge Summary (Signed)
Physician Discharge Summary  Patient ID: Steven Tate MRN: [id number] DOB/AGE: 1955-11-06 57 y.o.  Admit date: 09/12/2012 Discharge date: 09/13/2012   Procedures:  Procedure(s) (LRB): TOTAL HIP ARTHROPLASTY ANTERIOR APPROACH (Left)  Attending Physician:  Dr. Durene Romans   Admission Diagnoses:   Left hip OA / pain  Discharge Diagnoses:  Principal Problem:   S/P left THA, AA Active Problems:   Overweight (BMI 25.0-29.9)  Diagnosis  . Pneumonia  . Chronic kidney disease  . Arthritis    HPI:   Steven Tate, 57 y.o. male, has a history of pain and functional disability in the left hip(s) due to arthritis and patient has failed non-surgical conservative treatments for greater than 12 weeks to include NSAID's and/or analgesics and activity modification. Onset of symptoms was gradual starting >10 years ago with gradually worsening course since that time.The patient noted prior procedures of the hip to include arthroplasty on the right hip in September of 2013. Patient states that the right hip has been doing well and he is looking forward to getting the left hip done. Patient currently rates pain in the left hip at 8 out of 10 with activity. Patient has worsening of pain with activity and weight bearing, trendelenberg gait, pain that interfers with activities of daily living and pain with passive range of motion. Patient has evidence of periarticular osteophytes and joint space narrowing by imaging studies. This condition presents safety issues increasing the risk of falls. There is no current active infection. Risks, benefits and expectations were discussed with the patient. Patient understand the risks, benefits and expectations and wishes to proceed with surgery.  PCP: Fulton Reek, MD   Discharged Condition: good  Hospital Course:  Patient underwent the above stated procedure on 09/12/2012. Patient tolerated the procedure well and brought to the recovery room in good  condition and subsequently to the floor.  POD #1 BP: 107/66 ; Pulse: 60 ; Temp: 98 F (36.7 C) ; Resp: 16 Pt's foley was removed, as well as the hemovac drain removed. IV was changed to a saline lock. Patient reports pain as mild, pain well controlled. No events throughout the night. Ready to be discharged home with home health.  Neurovascular intact, dorsiflexion/plantar flexion intact, incision: dressing C/D/I, no cellulitis present and compartment soft.   LABS  Basename  09/13/12    0440  HGB  11.8  HCT  33.9    Discharge Exam: General appearance: alert, cooperative and no distress Extremities: Homans sign is negative, no sign of DVT, no edema, redness or tenderness in the calves or thighs and no ulcers, gangrene or trophic changes  Disposition:   Home or Self Care with follow up in 2 weeks   Follow-up Information   Follow up with Shelda Pal, MD. Schedule an appointment as soon as possible for a visit in 2 weeks.   Contact information:   [address] [place] [address] [phone]       Discharge Orders   Future Orders Complete By Expires     Call MD / Call 911  As directed     Comments:      If you experience chest pain or shortness of breath, CALL 911 and be transported to the hospital emergency room.  If you develope a fever above 101 F, pus (white drainage) or increased drainage or redness at the wound, or calf pain, call your surgeon's office.    Change dressing  As directed  Comments:      Maintain surgical dressing for 10-14 days, then replace with 4x4 guaze and tape. Keep the area dry and clean.    Constipation Prevention  As directed     Comments:      Drink plenty of fluids.  Prune juice may be helpful.  You may use a stool softener, such as Colace (over the counter) 100 mg twice a day.  Use MiraLax (over the counter) for constipation as needed.    Diet - low sodium heart healthy  As directed     Discharge instructions  As directed      Comments:      Maintain surgical dressing for 10-14 days, then replace with gauze and tape. Keep the area dry and clean until follow up. Follow up in 2 weeks at [facility]. Call with any questions or concerns.    Increase activity slowly as tolerated  As directed     TED hose  As directed     Comments:      Use stockings (TED hose) for 2 weeks on both leg(s).  You may remove them at night for sleeping.    Weight bearing as tolerated  As directed          Medication List    TAKE these medications       aspirin EC 325 MG tablet  Take 1 tablet (325 mg total) by mouth 2 (two) times daily.     DSS 100 MG Caps  Take 100 mg by mouth 2 (two) times daily.     ferrous sulfate 325 (65 FE) MG tablet  Take 1 tablet (325 mg total) by mouth 3 (three) times daily after meals.     HYDROcodone-acetaminophen 7.5-325 MG per tablet  Commonly known as:  NORCO  Take 1-2 tablets by mouth every 4 (four) hours as needed for pain.     methocarbamol 500 MG tablet  Commonly known as:  ROBAXIN  Take 1 tablet (500 mg total) by mouth every 6 (six) hours as needed (muscle spasms).     polyethylene glycol packet  Commonly known as:  MIRALAX / GLYCOLAX  Take 17 g by mouth daily as needed.         Signed: Anastasio Auerbach. Abriel Geesey   PAC  09/13/2012, 2:14 PM

## 2012-09-13 NOTE — Progress Notes (Signed)
   Subjective: 1 Day Post-Op Procedure(s) (LRB): TOTAL HIP ARTHROPLASTY ANTERIOR APPROACH (Left)   Patient reports pain as mild, pain well controlled. No events throughout the night. Ready to be discharged home with home health.   Objective:   VITALS:   Filed Vitals:   09/13/12 0759  BP: 107/66  Pulse: 60  Temp: 98 F (36.7 C)   Resp: 16    Neurovascular intact Dorsiflexion/Plantar flexion intact Incision: dressing C/D/I No cellulitis present Compartment soft  LABS  Recent Labs  09/13/12 0440  HGB 11.8*  HCT 33.9*  WBC 15.3*  PLT 153     Recent Labs  09/13/12 0440  NA 137  K 4.3  BUN 9  CREATININE 0.76  GLUCOSE 136*     Assessment/Plan: 1 Day Post-Op Procedure(s) (LRB): TOTAL HIP ARTHROPLASTY ANTERIOR APPROACH (Left) HV drain d/c'ed Foley cath d/c'ed Advance diet Up with therapy D/C IV fluids Discharge home with home health Follow up in 2 weeks at [facility]. Follow up with OLIN,Lopaka Karge D in 2 weeks.  Contact information:  [facility] [address] [place] [address] [phone]    Expected ABLA  Treated with iron and will observe  Overweight (BMI 25-29.9) Estimated body mass index is 29.13 kg/(m^2) as calculated from the following:   Height as of this encounter: 6\' 2"  (1.88 m).   Weight as of this encounter: 102.967 kg (227 lb). Patient also counseled that weight may inhibit the healing process Patient counseled that losing weight will help with future health issues     Anastasio Auerbach. Jarome Trull   PAC  09/13/2012, 9:41 AM

## 2012-09-13 NOTE — Progress Notes (Signed)
Physical Therapy Treatment Patient Details Name: Steven Tate MRN: [id number] DOB: June 29, 1955 Today's Date: 09/13/2012 Time: 4098-1191 PT Time Calculation (min): 39 min  PT Assessment / Plan / Recommendation Comments on Treatment Session  POD # 1 L Direct Anterior THR planning to D/C to home today.  Amb in hallway, practiced steps and performed ALL Direct Anterior THR TE's following handout.    Follow Up Recommendations  Home health PT     Does the patient have the potential to tolerate intense rehabilitation     Barriers to Discharge        Equipment Recommendations  None recommended by PT    Recommendations for Other Services    Frequency 7X/week   Plan      Precautions / Restrictions Precautions Precautions: Fall Restrictions Weight Bearing Restrictions: No Other Position/Activity Restrictions: WBAT   Pertinent Vitals/Pain C/o "soreness" ICE applied    Mobility  Bed Mobility Bed Mobility: Supine to Sit Supine to Sit: 6: Modified independent (Device/Increase time) Transfers Transfers: Sit to Stand;Stand to Sit Sit to Stand: 6: Modified independent (Device/Increase time) Stand to Sit: 6: Modified independent (Device/Increase time) Details for Transfer Assistance: increased time Ambulation/Gait Ambulation/Gait Assistance: 5: Supervision Ambulation Distance (Feet): 285 Feet Assistive device: Rolling walker Ambulation/Gait Assistance Details: good alternating gait and good safety cognition Gait Pattern: Step-through pattern Gait velocity: WFL Stairs: Yes Stairs Assistance: 5: Supervision;4: Min guard Stairs Assistance Details (indicate cue type and reason): one initial VC for safety Stair Management Technique: One rail Left;Forwards;With cane Number of Stairs: 4    Exercises   Total Hip Replacement TE's  SUPINE 10 reps ankle pumps 10 reps knee presses 10 reps heel slides 10 reps SAQ's 10 reps ABD STANDING 10 reps marching 10 reps ABD 10 reps  ext 10 reps kick backs Followed by ICE    PT Goals                                    progressing    Visit Information  Last PT Received On: 09/13/12    Subjective Data  Subjective: I am ready to go home Patient Stated Goal: Resume previous lifestyle with decreased pain   Cognition    good   Balance   good  End of Session PT - End of Session Equipment Utilized During Treatment: Gait belt Activity Tolerance: Patient tolerated treatment well Patient left: with call bell/phone within reach;with family/visitor present;in chair (ICE to hip)   Felecia Shelling  PTA [facility]  Acute  Rehab Pager      [phone]

## 2012-09-13 NOTE — Progress Notes (Signed)
Discharge summary sent to payer through [facility]  

## 2012-09-13 NOTE — Progress Notes (Signed)
OT Cancellation Note  Patient Details Name: Steven Tate MRN: [id number] DOB: 1956-05-30   Cancelled Treatment:     Reason Eval Not Completed: Pt reports no OT needs s/p L THA secondary to having R Hip done Sept 2013. Pt reports that he has DME, walk in shower w/ seat, A/E & will have family assist PRN at home. Sign off OT at this time.  Roselie Awkward Dixon 09/13/2012, 10:29 AM

## 2012-09-15 NOTE — Anesthesia Postprocedure Evaluation (Signed)
Anesthesia Post Note  Patient: Steven Tate  Procedure(s) Performed: Procedure(s) (LRB): TOTAL HIP ARTHROPLASTY ANTERIOR APPROACH (Left)  Anesthesia type: Spinal  Patient location: PACU  Post pain: Pain level controlled  Post assessment: Post-op Vital signs reviewed  Last Vitals:  Filed Vitals:   09/13/12 1000  BP: 94/53  Pulse: 69  Temp: 37.1 C  Resp: 16    Post vital signs: Reviewed  Level of consciousness: sedated  Complications: No apparent anesthesia complications

## 2012-10-29 IMAGING — CR DG PORTABLE PELVIS
1 series · 1 of 1 positions shown · non-contrast
Comparison: Right lateral cross-table radiographic images of the
right hip - earlier same day

CLINICAL DATA: Postop (right total hip replacement)

PORTABLE PELVIS

[AP]
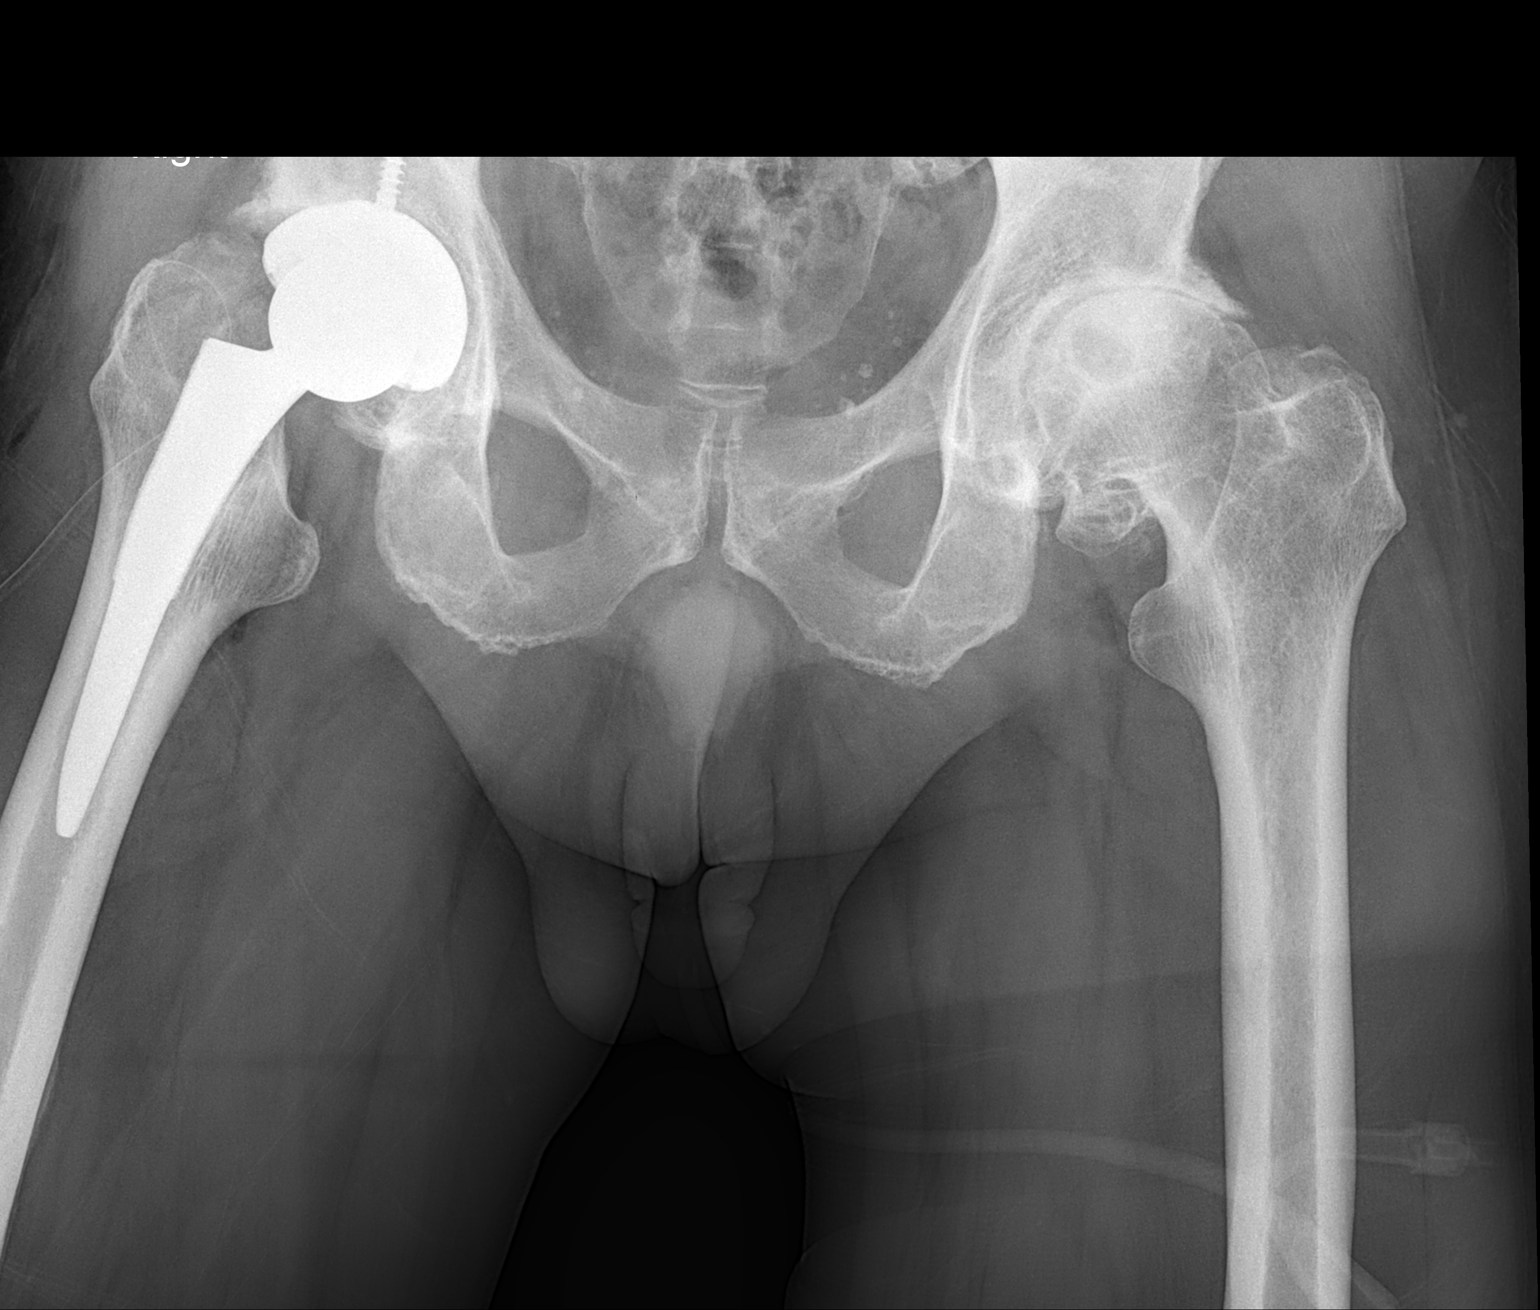

[1 of 1 positions shown; findings below may reference images not displayed]

FINDINGS: Examination interpreted in conjunction with right lateral
cross-table radiograph performed earlier same date.

Post right total hip replacement without evidence of hardware
failure or loosening.  A surgical drain overlies the operative
site.  There is a minimal amount of expected postoperative edema
and subcutaneous emphysema.  No radiopaque foreign body.  No
fracture.

Severe degenerative change of the contralateral left hip,
incompletely imaged.
IMPRESSION: Post right total hip replacement without evidence of complication.

## 2015-05-05 ENCOUNTER — Encounter: Payer: Self-pay | Admitting: *Deleted

## 2015-05-06 ENCOUNTER — Encounter
Admission: RE | Disposition: A | Discharge status: Home / Self Care | Payer: Self-pay | Source: Ambulatory Visit | Attending: Gastroenterology

## 2015-05-06 ENCOUNTER — Ambulatory Visit: Payer: BLUE CROSS/BLUE SHIELD | Admitting: Anesthesiology

## 2015-05-06 ENCOUNTER — Encounter: Payer: Self-pay | Admitting: *Deleted

## 2015-05-06 ENCOUNTER — Ambulatory Visit
Admission: RE | Admit: 2015-05-06 | Discharge: 2015-05-06 | Disposition: A | Discharge status: Home / Self Care | Payer: BLUE CROSS/BLUE SHIELD | Source: Ambulatory Visit | Attending: Gastroenterology | Admitting: Gastroenterology

## 2015-05-06 DIAGNOSIS — D123 Benign neoplasm of transverse colon: Secondary | ICD-10-CM | POA: Insufficient documentation

## 2015-05-06 DIAGNOSIS — Z96649 Presence of unspecified artificial hip joint: Secondary | ICD-10-CM | POA: Diagnosis not present

## 2015-05-06 DIAGNOSIS — N189 Chronic kidney disease, unspecified: Secondary | ICD-10-CM | POA: Insufficient documentation

## 2015-05-06 DIAGNOSIS — Z86010 Personal history of colonic polyps: Secondary | ICD-10-CM | POA: Diagnosis present

## 2015-05-06 DIAGNOSIS — D122 Benign neoplasm of ascending colon: Secondary | ICD-10-CM | POA: Diagnosis not present

## 2015-05-06 DIAGNOSIS — Z7982 Long term (current) use of aspirin: Secondary | ICD-10-CM | POA: Diagnosis not present

## 2015-05-06 DIAGNOSIS — M1990 Unspecified osteoarthritis, unspecified site: Secondary | ICD-10-CM | POA: Diagnosis not present

## 2015-05-06 DIAGNOSIS — K5730 Diverticulosis of large intestine without perforation or abscess without bleeding: Secondary | ICD-10-CM | POA: Insufficient documentation

## 2015-05-06 HISTORY — PX: COLONOSCOPY WITH PROPOFOL: SHX5780

## 2015-05-06 SURGERY — COLONOSCOPY WITH PROPOFOL
Anesthesia: General

## 2015-05-06 MED ORDER — SODIUM CHLORIDE 0.9 % IV SOLN
2.0000 g | Freq: Once | INTRAVENOUS | Status: AC
  Administered 2015-05-06: 08:00:00 via INTRAVENOUS
  Filled 2015-05-06: qty 2000

## 2015-05-06 MED ORDER — SODIUM CHLORIDE 0.9 % IV SOLN: INTRAVENOUS | Status: DC

## 2015-05-06 MED ORDER — PROPOFOL 10 MG/ML IV BOLUS
INTRAVENOUS | Status: DC | PRN
  Administered 2015-05-06: 50 mg via INTRAVENOUS

## 2015-05-06 MED ORDER — SODIUM CHLORIDE 0.9 % IV SOLN
INTRAVENOUS | Status: DC
Start: 2015-05-06 — End: 2015-05-06

## 2015-05-06 MED ORDER — MIDAZOLAM HCL 5 MG/5ML IJ SOLN
INTRAMUSCULAR | Status: DC | PRN
  Administered 2015-05-06: 2 mg via INTRAVENOUS

## 2015-05-06 MED ORDER — SODIUM CHLORIDE 0.9 % IV SOLN
INTRAVENOUS | Status: DC
Start: 2015-05-06 — End: 2015-05-06
  Administered 2015-05-06 (×3): via INTRAVENOUS

## 2015-05-06 MED ORDER — PROPOFOL 500 MG/50ML IV EMUL
INTRAVENOUS | Status: DC | PRN
  Administered 2015-05-06: 140 ug/kg/min via INTRAVENOUS

## 2015-05-06 MED ORDER — FENTANYL CITRATE (PF) 100 MCG/2ML IJ SOLN
INTRAMUSCULAR | Status: DC | PRN
  Administered 2015-05-06: 50 ug via INTRAVENOUS

## 2015-05-06 MED ORDER — EPHEDRINE SULFATE 50 MG/ML IJ SOLN
INTRAMUSCULAR | Status: DC | PRN
  Administered 2015-05-06 (×2): 10 mg via INTRAVENOUS

## 2015-05-06 NOTE — Op Note (Signed)
[facility] Gastroenterology Patient Name: Steven Tate Procedure Date: 05/06/2015 8:09 AM MRN: [id number] Account #: [id number] Date of Birth: 17-Mar-1956 Admit Type: Outpatient Age: 59 Room: [facility] ENDO ROOM 3 Gender: Male Note Status: Finalized Procedure:         Colonoscopy Indications:       Personal history of colonic polyps Providers:         Lollie Sails, MD Referring MD:      Shepard General, MD (Referring MD) Medicines:         Monitored Anesthesia Care Complications:     No immediate complications. Procedure:         Pre-Anesthesia Assessment:                    - ASA Grade Assessment: II - A patient with mild systemic                     disease.                    After obtaining informed consent, the colonoscope was                     passed under direct vision. Throughout the procedure, the                     patient's blood pressure, pulse, and oxygen saturations                     were monitored continuously. The Colonoscope was                     introduced through the anus and advanced to the the cecum,                     identified by appendiceal orifice and ileocecal valve. The                     colonoscopy was performed with moderate difficulty due to                     significant looping. Successful completion of the                     procedure was aided by changing the patient to a supine                     position and using manual pressure. The patient tolerated                     the procedure well. The quality of the bowel preparation                     was fair. Findings:      Multiple small-mouthed diverticula were found in the sigmoid colon and       in the descending colon.      Three flat polyps were found in the distal transverse colon. The polyps       were 2 to 3 mm in size. These polyps were removed with a cold biopsy       forceps. Resection and retrieval were complete.      A 4 mm polyp was found in  the ascending colon. The polyp was       semi-pedunculated. The polyp was  removed with a cold snare. Resection       and retrieval were complete.      A 3 mm polyp was found in the ascending colon. The polyp was sessile.       The polyp was removed with a cold biopsy forceps. Resection and       retrieval were complete.      The retroflexed view of the distal rectum and anal verge was normal and       showed no anal or rectal abnormalities.      The digital rectal exam was normal. Impression:        - Diverticulosis in the sigmoid colon and in the                     descending colon.                    - Three 2 to 3 mm polyps in the distal transverse colon.                     Resected and retrieved.                    - One 4 mm polyp in the ascending colon. Resected and                     retrieved.                    - One 3 mm polyp in the ascending colon. Resected and                     retrieved.                    - The distal rectum and anal verge are normal on                     retroflexion view. Recommendation:    - Discharge patient to home.                    - Telephone GI clinic for pathology results in 1 week. Procedure Code(s): --- Professional ---                    [phone], Colonoscopy, flexible; with removal of tumor(s),                     polyp(s), or other lesion(s) by snare technique                    45380, 23, Colonoscopy, flexible; with biopsy, single or                     multiple Diagnosis Code(s): --- Professional ---                    D12.3, Benign neoplasm of transverse colon                    D12.2, Benign neoplasm of ascending colon                    Z86.010, Personal history of colonic polyps                    K57.30, Diverticulosis of large intestine without  perforation or abscess without bleeding CPT copyright 2014 American Medical Association. All rights reserved. The codes documented in this report are preliminary  and upon coder review may  be revised to meet current compliance requirements. Lollie Sails, MD 05/06/2015 8:57:55 AM This report has been signed electronically. Number of Addenda: 0 Note Initiated On: 05/06/2015 8:09 AM Scope Withdrawal Time: 0 hours 8 minutes 15 seconds  Total Procedure Duration: 0 hours 36 minutes 33 seconds       [facility]

## 2015-05-06 NOTE — Anesthesia Preprocedure Evaluation (Signed)
Anesthesia Evaluation  Patient identified by MRN, date of birth, ID band Patient awake    Reviewed: Allergy & Precautions, NPO status , Patient's Chart, lab work & pertinent test results  Airway Mallampati: II       Dental no notable dental hx.    Pulmonary    Pulmonary exam normal        Cardiovascular Exercise Tolerance: Good  Rhythm:Regular Rate:Normal     Neuro/Psych    GI/Hepatic negative GI ROS, Neg liver ROS,   Endo/Other  negative endocrine ROS  Renal/GU negative Renal ROS     Musculoskeletal  (+) Arthritis ,   Abdominal Normal abdominal exam  (+)   Peds negative pediatric ROS (+)  Hematology  (+) anemia ,   Anesthesia Other Findings   Reproductive/Obstetrics                             Anesthesia Physical Anesthesia Plan  ASA: II  Anesthesia Plan: General   Post-op Pain Management:    Induction: Intravenous  Airway Management Planned: Nasal Cannula  Additional Equipment:   Intra-op Plan:   Post-operative Plan:   Informed Consent: I have reviewed the patients History and Physical, chart, labs and discussed the procedure including the risks, benefits and alternatives for the proposed anesthesia with the patient or authorized representative who has indicated his/her understanding and acceptance.     Plan Discussed with: CRNA  Anesthesia Plan Comments:         Anesthesia Quick Evaluation

## 2015-05-06 NOTE — H&P (Signed)
Outpatient short stay form Pre-procedure 05/06/2015 8:03 AM Lollie Sails MD  Primary Physician: Dr Burt Ek  Reason for visit:  Colonoscopy  History of present illness:  Patient is a 59 year old male presenting today for colonoscopy. He has a personal history of adenomatous colon polyps. He tolerated his prep well. He takes no aspirin or blood thinning products. He has received to prophylaxis due to his history of recent hip replacement.    Current facility-administered medications:  .  0.9 %  sodium chloride infusion, , Intravenous, Continuous, Lollie Sails, MD .  0.9 %  sodium chloride infusion, , Intravenous, Continuous, Lollie Sails, MD, Last Rate: 20 mL/hr at 05/06/15 0741 .  0.9 %  sodium chloride infusion, , Intravenous, Continuous, Lollie Sails, MD  Prescriptions prior to admission  Medication Sig Dispense Refill Last Dose  . aspirin EC 325 MG tablet Take 1 tablet (325 mg total) by mouth 2 (two) times daily. 60 tablet 0 Past Week at Unknown time  . docusate sodium 100 MG CAPS Take 100 mg by mouth 2 (two) times daily. 10 capsule    . ferrous sulfate 325 (65 FE) MG tablet Take 1 tablet (325 mg total) by mouth 3 (three) times daily after meals. (Patient not taking: Reported on 05/06/2015)   Not Taking at Unknown time  . HYDROcodone-acetaminophen (NORCO) 7.5-325 MG per tablet Take 1-2 tablets by mouth every 4 (four) hours as needed for pain. (Patient not taking: Reported on 05/06/2015) 120 tablet 0 Completed Course at Unknown time  . methocarbamol (ROBAXIN) 500 MG tablet Take 1 tablet (500 mg total) by mouth every 6 (six) hours as needed (muscle spasms). (Patient not taking: Reported on 05/06/2015)   Completed Course at Unknown time  . polyethylene glycol (MIRALAX / GLYCOLAX) packet Take 17 g by mouth daily as needed. 14 each       No Known Allergies   Past Medical History  Diagnosis Date  . Pneumonia     hx of 2001  . Chronic kidney disease     hx of uti    . Arthritis     Review of systems:      Physical Exam    Heart and lungs: Regular rate and rhythm without rub or gallop, lungs are bilaterally clear    HEENT: Normocephalic atraumatic eyes are anicteric    Other:     Pertinant exam for procedure: Soft nontender nondistended bowel sounds positive normoactive    Planned proceedures: Colonoscopy and indicated procedures. I have discussed the risks benefits and complications of procedures to include not limited to bleeding, infection, perforation and the risk of sedation and the patient wishes to proceed.    Lollie Sails, MD Gastroenterology 05/06/2015  8:03 AM

## 2015-05-06 NOTE — Anesthesia Postprocedure Evaluation (Signed)
  Anesthesia Post-op Note  Patient: Steven Tate  Procedure(s) Performed: Procedure(s): COLONOSCOPY WITH PROPOFOL (N/A)  Anesthesia type:General  Patient location: PACU  Post pain: Pain level controlled  Post assessment: Post-op Vital signs reviewed, Patient's Cardiovascular Status Stable, Respiratory Function Stable, Patent Airway and No signs of Nausea or vomiting  Post vital signs: Reviewed and stable  Last Vitals:  Filed Vitals:   05/06/15 0935  BP: 93/48  Pulse: 69  Temp:   Resp: 12    Level of consciousness: awake, alert  and patient cooperative  Complications: No apparent anesthesia complications

## 2015-05-06 NOTE — Transfer of Care (Signed)
Immediate Anesthesia Transfer of Care Note  Patient: Steven Tate  Procedure(s) Performed: Procedure(s): COLONOSCOPY WITH PROPOFOL (N/A)  Patient Location: PACU  Anesthesia Type:General  Level of Consciousness: awake  Airway & Oxygen Therapy: Patient Spontanous Breathing and Patient connected to nasal cannula oxygen  Post-op Assessment: Report given to RN and Post -op Vital signs reviewed and stable  Post vital signs: Reviewed and stable  Last Vitals:  Filed Vitals:   05/06/15 0720  BP: 105/71  Pulse: 80  Temp: 36.9 C  Resp: 17    Complications: No apparent anesthesia complications

## 2015-05-07 ENCOUNTER — Encounter: Payer: Self-pay | Admitting: Gastroenterology

## 2015-05-07 LAB — SURGICAL PATHOLOGY

## 2017-03-21 DIAGNOSIS — N400 Enlarged prostate without lower urinary tract symptoms: Secondary | ICD-10-CM | POA: Insufficient documentation

## 2018-06-01 ENCOUNTER — Other Ambulatory Visit: Payer: Self-pay | Admitting: Urology

## 2018-06-01 DIAGNOSIS — R972 Elevated prostate specific antigen [PSA]: Secondary | ICD-10-CM

## 2018-06-18 ENCOUNTER — Ambulatory Visit
Admission: RE | Admit: 2018-06-18 | Discharge: 2018-06-18 | Disposition: A | Discharge status: Home / Self Care | Payer: BLUE CROSS/BLUE SHIELD | Source: Ambulatory Visit | Attending: Urology | Admitting: Urology

## 2018-06-18 DIAGNOSIS — R972 Elevated prostate specific antigen [PSA]: Secondary | ICD-10-CM | POA: Diagnosis present

## 2018-06-18 MED ORDER — GADOBUTROL 1 MMOL/ML IV SOLN
10.0000 mL | Freq: Once | INTRAVENOUS | Status: AC | PRN
  Administered 2018-06-18: 10 mL via INTRAVENOUS

## 2019-01-02 DIAGNOSIS — N5201 Erectile dysfunction due to arterial insufficiency: Secondary | ICD-10-CM | POA: Diagnosis not present

## 2019-01-02 DIAGNOSIS — E291 Testicular hypofunction: Secondary | ICD-10-CM | POA: Diagnosis not present

## 2019-01-02 DIAGNOSIS — N400 Enlarged prostate without lower urinary tract symptoms: Secondary | ICD-10-CM | POA: Diagnosis not present

## 2019-01-02 DIAGNOSIS — Z79899 Other long term (current) drug therapy: Secondary | ICD-10-CM | POA: Diagnosis not present

## 2019-01-02 DIAGNOSIS — N401 Enlarged prostate with lower urinary tract symptoms: Secondary | ICD-10-CM | POA: Diagnosis not present

## 2019-01-10 ENCOUNTER — Encounter
Admission: RE | Admit: 2019-01-10 | Discharge: 2019-01-10 | Disposition: A | Discharge status: Home / Self Care | Payer: 59 | Source: Ambulatory Visit | Attending: Gastroenterology | Admitting: Gastroenterology

## 2019-01-10 ENCOUNTER — Other Ambulatory Visit: Payer: Self-pay

## 2019-01-10 DIAGNOSIS — Z1159 Encounter for screening for other viral diseases: Secondary | ICD-10-CM | POA: Diagnosis not present

## 2019-01-10 LAB — SARS CORONAVIRUS 2 (TAT 6-24 HRS): SARS Coronavirus 2: NEGATIVE

## 2019-01-12 ENCOUNTER — Encounter: Payer: Self-pay | Admitting: *Deleted

## 2019-01-15 ENCOUNTER — Ambulatory Visit: Payer: 59 | Admitting: Anesthesiology

## 2019-01-15 ENCOUNTER — Encounter: Payer: Self-pay | Admitting: *Deleted

## 2019-01-15 ENCOUNTER — Ambulatory Visit
Admission: RE | Admit: 2019-01-15 | Discharge: 2019-01-15 | Disposition: A | Discharge status: Home / Self Care | Payer: 59 | Source: Ambulatory Visit | Attending: Gastroenterology | Admitting: Gastroenterology

## 2019-01-15 ENCOUNTER — Other Ambulatory Visit: Payer: Self-pay

## 2019-01-15 ENCOUNTER — Encounter
Admission: RE | Disposition: A | Discharge status: Home / Self Care | Payer: Self-pay | Source: Ambulatory Visit | Attending: Gastroenterology

## 2019-01-15 DIAGNOSIS — D124 Benign neoplasm of descending colon: Secondary | ICD-10-CM | POA: Diagnosis not present

## 2019-01-15 DIAGNOSIS — D123 Benign neoplasm of transverse colon: Secondary | ICD-10-CM | POA: Insufficient documentation

## 2019-01-15 DIAGNOSIS — Z86010 Personal history of colonic polyps: Secondary | ICD-10-CM | POA: Insufficient documentation

## 2019-01-15 DIAGNOSIS — N189 Chronic kidney disease, unspecified: Secondary | ICD-10-CM | POA: Diagnosis not present

## 2019-01-15 DIAGNOSIS — Z96643 Presence of artificial hip joint, bilateral: Secondary | ICD-10-CM | POA: Diagnosis not present

## 2019-01-15 DIAGNOSIS — Z1211 Encounter for screening for malignant neoplasm of colon: Secondary | ICD-10-CM | POA: Diagnosis not present

## 2019-01-15 DIAGNOSIS — K635 Polyp of colon: Secondary | ICD-10-CM | POA: Diagnosis not present

## 2019-01-15 DIAGNOSIS — Z09 Encounter for follow-up examination after completed treatment for conditions other than malignant neoplasm: Secondary | ICD-10-CM | POA: Insufficient documentation

## 2019-01-15 DIAGNOSIS — K5730 Diverticulosis of large intestine without perforation or abscess without bleeding: Secondary | ICD-10-CM | POA: Insufficient documentation

## 2019-01-15 DIAGNOSIS — K5790 Diverticulosis of intestine, part unspecified, without perforation or abscess without bleeding: Secondary | ICD-10-CM | POA: Diagnosis not present

## 2019-01-15 DIAGNOSIS — M1990 Unspecified osteoarthritis, unspecified site: Secondary | ICD-10-CM | POA: Diagnosis not present

## 2019-01-15 HISTORY — PX: COLONOSCOPY WITH PROPOFOL: SHX5780

## 2019-01-15 SURGERY — COLONOSCOPY WITH PROPOFOL
Anesthesia: General

## 2019-01-15 MED ORDER — PROPOFOL 500 MG/50ML IV EMUL
INTRAVENOUS | Status: AC
  Filled 2019-01-15: qty 50

## 2019-01-15 MED ORDER — FENTANYL CITRATE (PF) 100 MCG/2ML IJ SOLN
INTRAMUSCULAR | Status: AC
  Filled 2019-01-15: qty 2

## 2019-01-15 MED ORDER — SODIUM CHLORIDE 0.9 % IV SOLN
INTRAVENOUS | Status: DC
  Administered 2019-01-15 (×2): via INTRAVENOUS

## 2019-01-15 MED ORDER — PROPOFOL 500 MG/50ML IV EMUL
INTRAVENOUS | Status: DC | PRN
  Administered 2019-01-15: 50 ug/kg/min via INTRAVENOUS

## 2019-01-15 MED ORDER — MIDAZOLAM HCL 2 MG/2ML IJ SOLN
INTRAMUSCULAR | Status: AC
  Filled 2019-01-15: qty 2

## 2019-01-15 MED ORDER — MIDAZOLAM HCL 5 MG/5ML IJ SOLN
INTRAMUSCULAR | Status: DC | PRN
  Administered 2019-01-15 (×2): 1 mg via INTRAVENOUS
  Administered 2019-01-15: 2 mg via INTRAVENOUS

## 2019-01-15 MED ORDER — SODIUM CHLORIDE 0.9 % IV SOLN: INTRAVENOUS | Status: DC

## 2019-01-15 MED ORDER — PROPOFOL 10 MG/ML IV BOLUS
INTRAVENOUS | Status: DC | PRN
  Administered 2019-01-15: 30 mg via INTRAVENOUS
  Administered 2019-01-15: 20 mg via INTRAVENOUS

## 2019-01-15 MED ORDER — FENTANYL CITRATE (PF) 100 MCG/2ML IJ SOLN
INTRAMUSCULAR | Status: DC | PRN
  Administered 2019-01-15 (×2): 50 ug via INTRAVENOUS

## 2019-01-15 MED ORDER — LIDOCAINE HCL (PF) 2 % IJ SOLN
INTRAMUSCULAR | Status: DC | PRN
  Administered 2019-01-15: 100 mg

## 2019-01-15 MED ORDER — LIDOCAINE HCL (PF) 2 % IJ SOLN
INTRAMUSCULAR | Status: AC
  Filled 2019-01-15: qty 10

## 2019-01-15 NOTE — Anesthesia Postprocedure Evaluation (Signed)
Anesthesia Post Note  Patient: Steven Tate  Procedure(s) Performed: COLONOSCOPY WITH PROPOFOL (N/A )  Patient location during evaluation: Endoscopy Anesthesia Type: General Level of consciousness: awake and alert Pain management: pain level controlled Vital Signs Assessment: post-procedure vital signs reviewed and stable Respiratory status: spontaneous breathing, nonlabored ventilation, respiratory function stable and patient connected to nasal cannula oxygen Cardiovascular status: blood pressure returned to baseline and stable Postop Assessment: no apparent nausea or vomiting Anesthetic complications: no     Last Vitals:  Vitals:   01/15/19 1120 01/15/19 1136  BP: 118/69 127/71  Pulse:  65  Resp:    Temp: (!) 36.2 C   SpO2: 97% 96%    Last Pain:  Vitals:   01/15/19 1120  TempSrc: Tympanic  PainSc:                  [name]

## 2019-01-15 NOTE — H&P (Signed)
Outpatient short stay form Pre-procedure 01/15/2019 10:34 AM Lollie Sails MD  Primary Physician: Dr. Melanie Crazier  Reason for visit: Colonoscopy  History of present illness: Patient is a 63 year old male presenting today for colonoscopy in regards to his personal history of adenomatous colon polyps.  His last colonoscopy was 05/06/2015 with a finding of 5 polyps at that time.  These were all adenomas with the exception of one.  Tolerated his prep well.  He takes no aspirin or blood thinning agent and does not take the aspirin listed in his record.    Current Facility-Administered Medications:  .  0.9 %  sodium chloride infusion, , Intravenous, Continuous, Lollie Sails, MD .  0.9 %  sodium chloride infusion, , Intravenous, Continuous, Lollie Sails, MD, Last Rate: 20 mL/hr at 01/15/19 1020  Medications Prior to Admission  Medication Sig Dispense Refill Last Dose  . docusate sodium 100 MG CAPS Take 100 mg by mouth 2 (two) times daily. 10 capsule  01/14/2019 at Unknown time  . tadalafil (CIALIS) 5 MG tablet Take 5 mg by mouth daily as needed for erectile dysfunction.   01/14/2019 at 1200  . tamsulosin (FLOMAX) 0.4 MG CAPS capsule Take 0.4 mg by mouth daily.   01/14/2019 at 1200  . Testosterone Enanthate (XYOSTED) 75 MG/0.5ML SOAJ Inject 75 mg into the skin every 7 (seven) days.   Past Week at Unknown time  . aspirin EC 325 MG tablet Take 1 tablet (325 mg total) by mouth 2 (two) times daily. (Patient not taking: Reported on 01/15/2019) 60 tablet 0 Not Taking at Unknown time     No Known Allergies   Past Medical History:  Diagnosis Date  . Arthritis   . Chronic kidney disease    hx of uti   . Pneumonia    hx of 2001    Review of systems:      Physical Exam    Heart and lungs: Regular rate and rhythm without rub or gallop lungs are bilaterally clear    HEENT: Normocephalic atraumatic eyes are anicteric    Other:    Pertinant exam for procedure: Soft  nontender nondistended bowel sounds positive normoactive    Planned proceedures: Colonoscopy and indicated procedures. I have discussed the risks benefits and complications of procedures to include not limited to bleeding, infection, perforation and the risk of sedation and the patient wishes to proceed.    Lollie Sails, MD Gastroenterology 01/15/2019  10:34 AM

## 2019-01-15 NOTE — Op Note (Signed)
[facility] Gastroenterology Patient Name: Steven Tate Procedure Date: 01/15/2019 10:31 AM MRN: [id number] Account #: [id number] Date of Birth: 1956/02/02 Admit Type: Outpatient Age: 63 Room: [facility] ENDO ROOM 1 Gender: Male Note Status: Finalized Procedure:            Colonoscopy Indications:          Personal history of colonic polyps Providers:            Lollie Sails, MD Medicines:            Monitored Anesthesia Care Complications:        No immediate complications. Procedure:            Pre-Anesthesia Assessment:                       - ASA Grade Assessment: III - A patient with severe                        systemic disease.                       After obtaining informed consent, the colonoscope was                        passed under direct vision. Throughout the procedure,                        the patient's blood pressure, pulse, and oxygen                        saturations were monitored continuously. The                        Colonoscope was introduced through the anus and                        advanced to the the cecum, identified by appendiceal                        orifice and ileocecal valve. The colonoscopy was                        performed without difficulty. The patient tolerated the                        procedure well. The quality of the bowel preparation                        was good. Findings:      Multiple small to medium -mouthed diverticula were found in the sigmoid       colon and descending colon.      Two sessile polyps were found in the mid transverse colon. The polyps       were 2 to 3 mm in size. These polyps were removed with a cold biopsy       forceps. Resection and retrieval were complete.      Two sessile polyps were found in the hepatic flexure. The polyps were 2       to 3 mm in size. These polyps were removed with a cold biopsy forceps.       Resection and retrieval were complete.  A 5 mm polyp was  found in the proximal transverse colon. The polyp was       sessile. The polyp was removed with a cold snare. Resection and       retrieval were complete.      Two sessile polyps were found in the distal transverse colon. The polyps       were 3 to 4 mm in size. These polyps were removed with a cold snare.       Resection and retrieval were complete.      Two sessile polyps were found in the descending colon. The polyps were 2       to 3 mm in size. These polyps were removed with a cold snare. Resection       and retrieval were complete.      The retroflexed view of the distal rectum and anal verge was normal and       showed no anal or rectal abnormalities.      The digital rectal exam was normal. Impression:           - Diverticulosis in the sigmoid colon and in the                        descending colon.                       - Two 2 to 3 mm polyps in the mid transverse colon,                        removed with a cold biopsy forceps. Resected and                        retrieved.                       - Two 2 to 3 mm polyps at the hepatic flexure, removed                        with a cold biopsy forceps. Resected and retrieved.                       - One 5 mm polyp in the proximal transverse colon,                        removed with a cold snare. Resected and retrieved.                       - Two 3 to 4 mm polyps in the distal transverse colon,                        removed with a cold snare. Resected and retrieved.                       - Two 2 to 3 mm polyps in the descending colon, removed                        with a cold snare. Resected and retrieved.                       - The distal rectum and anal verge are  normal on                        retroflexion view. Recommendation:       - Discharge patient to home.                       - Soft diet today, then advance as tolerated to advance                        diet as tolerated. Procedure Code(s):    --- Professional  ---                       [phone], Colonoscopy, flexible; with removal of tumor(s),                        polyp(s), or other lesion(s) by snare technique                       [id number], 41, Colonoscopy, flexible; with biopsy, single                        or multiple CPT copyright 2019 American Medical Association. All rights reserved. The codes documented in this report are preliminary and upon coder review may  be revised to meet current compliance requirements. Lollie Sails, MD 01/15/2019 11:21:10 AM This report has been signed electronically. Number of Addenda: 0 Note Initiated On: 01/15/2019 10:31 AM Scope Withdrawal Time: 0 hours 21 minutes 26 seconds  Total Procedure Duration: 0 hours 30 minutes 4 seconds       [employer]

## 2019-01-15 NOTE — Transfer of Care (Signed)
Immediate Anesthesia Transfer of Care Note  Patient: Steven Tate  Procedure(s) Performed: COLONOSCOPY WITH PROPOFOL (N/A )  Patient Location: PACU  Anesthesia Type:General  Level of Consciousness: sedated  Airway & Oxygen Therapy: Patient Spontanous Breathing and Patient connected to nasal cannula oxygen  Post-op Assessment: Report given to RN and Post -op Vital signs reviewed and stable  Post vital signs: Reviewed and stable  Last Vitals:  Vitals Value Taken Time  BP 118/69 01/15/19 1121  Temp 36.2 C 01/15/19 1120  Pulse 75 01/15/19 1121  Resp 18 01/15/19 1121  SpO2 97 % 01/15/19 1121  Vitals shown include unvalidated device data.  Last Pain:  Vitals:   01/15/19 1120  TempSrc: Tympanic  PainSc:          Complications: No apparent anesthesia complications

## 2019-01-15 NOTE — Anesthesia Post-op Follow-up Note (Signed)
Anesthesia QCDR form completed.        

## 2019-01-15 NOTE — Anesthesia Preprocedure Evaluation (Signed)
Anesthesia Evaluation  Patient identified by MRN, date of birth, ID band Patient awake    Reviewed: Allergy & Precautions, H&P , NPO status , Patient's Chart, lab work & pertinent test results  History of Anesthesia Complications Negative for: history of anesthetic complications  Airway Mallampati: II  TM Distance: >3 FB Neck ROM: full    Dental  (+) Chipped   Pulmonary neg shortness of breath, pneumonia,           Cardiovascular Exercise Tolerance: Good (-) angina(-) Past MI and (-) DOE negative cardio ROS       Neuro/Psych negative neurological ROS  negative psych ROS   GI/Hepatic negative GI ROS, Neg liver ROS,   Endo/Other  negative endocrine ROS  Renal/GU CRFRenal disease  negative genitourinary   Musculoskeletal  (+) Arthritis ,   Abdominal   Peds  Hematology negative hematology ROS (+)   Anesthesia Other Findings Past Medical History: No date: Arthritis No date: Chronic kidney disease     Comment:  hx of uti  No date: Pneumonia     Comment:  hx of 2001  Past Surgical History: No date: APPENDECTOMY No date: BACK SURGERY 05/06/2015: COLONOSCOPY WITH PROPOFOL; N/A     Comment:  Procedure: COLONOSCOPY WITH PROPOFOL;  Surgeon: Lollie Sails, MD;  Location: [facility] ENDOSCOPY;  Service:               Endoscopy;  Laterality: N/A; No date: HAND TENDON SURGERY No date: TONSILLECTOMY 02/22/2012: TOTAL HIP ARTHROPLASTY     Comment:  Procedure: TOTAL HIP ARTHROPLASTY ANTERIOR APPROACH;                Surgeon: Mauri Pole, MD;  Location: [facility] ORS;  Service:              Orthopedics;  Laterality: Right; 09/12/2012: TOTAL HIP ARTHROPLASTY; Left     Comment:  Procedure: TOTAL HIP ARTHROPLASTY ANTERIOR APPROACH;                Surgeon: Mauri Pole, MD;  Location: [facility] ORS;  Service:              Orthopedics;  Laterality: Left;  BMI    Body Mass Index: 30.34 kg/m       Reproductive/Obstetrics negative OB ROS                             Anesthesia Physical Anesthesia Plan  ASA: III  Anesthesia Plan: General   Post-op Pain Management:    Induction: Intravenous  PONV Risk Score and Plan: Propofol infusion and TIVA  Airway Management Planned: Natural Airway and Nasal Cannula  Additional Equipment:   Intra-op Plan:   Post-operative Plan:   Informed Consent: I have reviewed the patients History and Physical, chart, labs and discussed the procedure including the risks, benefits and alternatives for the proposed anesthesia with the patient or authorized representative who has indicated his/her understanding and acceptance.     Dental Advisory Given  Plan Discussed with: Anesthesiologist, CRNA and Surgeon  Anesthesia Plan Comments: (Patient consented for risks of anesthesia including but not limited to:  - adverse reactions to medications - risk of intubation if required - damage to teeth, lips or other oral mucosa - sore throat or hoarseness - Damage to heart, brain, lungs or loss of life  Patient voiced understanding.)  Anesthesia Quick Evaluation  

## 2019-01-16 LAB — SURGICAL PATHOLOGY

## 2019-03-15 DIAGNOSIS — Z23 Encounter for immunization: Secondary | ICD-10-CM | POA: Diagnosis not present

## 2021-12-28 ENCOUNTER — Telehealth: Payer: Self-pay | Admitting: Genetic Counselor

## 2021-12-28 NOTE — Telephone Encounter (Signed)
Scheduled appt per 7/7 referral. Pt is aware of appt date and time. Pt is aware to arrive 15 mins prior to appt time and to bring and updated insurance card. Pt is aware of appt location.

## 2022-01-04 ENCOUNTER — Encounter: Payer: Self-pay | Admitting: *Deleted

## 2022-01-05 ENCOUNTER — Encounter
Admission: RE | Disposition: A | Discharge status: Home / Self Care | Payer: Self-pay | Source: Home / Self Care | Attending: Gastroenterology

## 2022-01-05 ENCOUNTER — Ambulatory Visit
Admission: RE | Admit: 2022-01-05 | Discharge: 2022-01-05 | Disposition: A | Discharge status: Home / Self Care | Payer: Medicare Other | Attending: Gastroenterology | Admitting: Gastroenterology

## 2022-01-05 ENCOUNTER — Ambulatory Visit: Payer: Medicare Other | Admitting: Anesthesiology

## 2022-01-05 DIAGNOSIS — E669 Obesity, unspecified: Secondary | ICD-10-CM | POA: Insufficient documentation

## 2022-01-05 DIAGNOSIS — K5730 Diverticulosis of large intestine without perforation or abscess without bleeding: Secondary | ICD-10-CM | POA: Diagnosis not present

## 2022-01-05 DIAGNOSIS — D124 Benign neoplasm of descending colon: Secondary | ICD-10-CM | POA: Diagnosis not present

## 2022-01-05 DIAGNOSIS — Z1211 Encounter for screening for malignant neoplasm of colon: Secondary | ICD-10-CM | POA: Insufficient documentation

## 2022-01-05 DIAGNOSIS — K640 First degree hemorrhoids: Secondary | ICD-10-CM | POA: Diagnosis not present

## 2022-01-05 DIAGNOSIS — Z86010 Personal history of colonic polyps: Secondary | ICD-10-CM | POA: Diagnosis not present

## 2022-01-05 DIAGNOSIS — D122 Benign neoplasm of ascending colon: Secondary | ICD-10-CM | POA: Insufficient documentation

## 2022-01-05 HISTORY — PX: COLONOSCOPY WITH PROPOFOL: SHX5780

## 2022-01-05 SURGERY — COLONOSCOPY WITH PROPOFOL
Anesthesia: General

## 2022-01-05 MED ORDER — PROPOFOL 10 MG/ML IV BOLUS
INTRAVENOUS | Status: AC
  Filled 2022-01-05: qty 20

## 2022-01-05 MED ORDER — PROPOFOL 10 MG/ML IV BOLUS
INTRAVENOUS | Status: DC | PRN
  Administered 2022-01-05: 50 mg via INTRAVENOUS
  Administered 2022-01-05: 150 ug/kg/min via INTRAVENOUS

## 2022-01-05 MED ORDER — SODIUM CHLORIDE 0.9 % IV SOLN
INTRAVENOUS | Status: DC
Start: 2022-01-05 — End: 2022-01-05

## 2022-01-05 NOTE — Op Note (Signed)
[facility] Gastroenterology Patient Name: Steven Tate Procedure Date: 01/05/2022 8:58 AM MRN: [id number] Account #: [id number] Date of Birth: [birth year] Admit Type: Outpatient Age: 66 Room: [facility] ENDO ROOM 1 Gender: Male Note Status: Finalized Instrument Name: Jasper Riling [id number] Procedure:             Colonoscopy Indications:           Surveillance: Personal history of adenomatous polyps                         on last colonoscopy 3 years ago Providers:             Andrey Farmer MD, MD Referring MD:          Mitzie Na. Quillian Quince MD, MD (Referring MD) Medicines:             Monitored Anesthesia Care Complications:         No immediate complications. Estimated blood loss:                         Minimal. Procedure:             Pre-Anesthesia Assessment:                        - Prior to the procedure, a History and Physical was                         performed, and patient medications and allergies were                         reviewed. The patient is competent. The risks and                         benefits of the procedure and the sedation options and                         risks were discussed with the patient. All questions                         were answered and informed consent was obtained.                         Patient identification and proposed procedure were                         verified by the physician, the nurse, the                         anesthesiologist, the anesthetist and the technician                         in the endoscopy suite. Mental Status Examination:                         alert and oriented. Airway Examination: normal                         oropharyngeal airway and neck mobility. Respiratory  Examination: clear to auscultation. CV Examination:                         normal. Prophylactic Antibiotics: The patient does not                         require prophylactic antibiotics. Prior                          Anticoagulants: The patient has taken no previous                         anticoagulant or antiplatelet agents. ASA Grade                         Assessment: II - A patient with mild systemic disease.                         After reviewing the risks and benefits, the patient                         was deemed in satisfactory condition to undergo the                         procedure. The anesthesia plan was to use monitored                         anesthesia care (MAC). Immediately prior to                         administration of medications, the patient was                         re-assessed for adequacy to receive sedatives. The                         heart rate, respiratory rate, oxygen saturations,                         blood pressure, adequacy of pulmonary ventilation, and                         response to care were monitored throughout the                         procedure. The physical status of the patient was                         re-assessed after the procedure.                        After obtaining informed consent, the colonoscope was                         passed under direct vision. Throughout the procedure,                         the patient's blood pressure, pulse, and oxygen  saturations were monitored continuously. The                         Colonoscope was introduced through the anus and                         advanced to the the cecum, identified by appendiceal                         orifice and ileocecal valve. The colonoscopy was                         performed without difficulty. The patient tolerated                         the procedure well. The quality of the bowel                         preparation was good. Findings:      The perianal and digital rectal examinations were normal.      A 4 mm polyp was found in the ascending colon. The polyp was sessile.       The polyp was removed with a cold snare. Resection  and retrieval were       complete. Estimated blood loss was minimal.      A 1 mm polyp was found in the descending colon. The polyp was sessile.       The polyp was removed with a jumbo cold forceps. Resection and retrieval       were complete. Estimated blood loss was minimal.      A few small-mouthed diverticula were found in the sigmoid colon.      Internal hemorrhoids were found during retroflexion. The hemorrhoids       were Grade I (internal hemorrhoids that do not prolapse).      The exam was otherwise without abnormality on direct and retroflexion       views. Impression:            - One 4 mm polyp in the ascending colon, removed with                         a cold snare. Resected and retrieved.                        - One 1 mm polyp in the descending colon, removed with                         a jumbo cold forceps. Resected and retrieved.                        - Diverticulosis in the sigmoid colon.                        - Internal hemorrhoids.                        - The examination was otherwise normal on direct and                         retroflexion views. Recommendation:        -  Discharge patient to home.                        - Resume previous diet.                        - Continue present medications.                        - Await pathology results.                        - Repeat colonoscopy in 5 years for surveillance.                        - Return to referring physician as previously                         scheduled. Procedure Code(s):     --- Professional ---                        [phone], Colonoscopy, flexible; with removal of                         tumor(s), polyp(s), or other lesion(s) by snare                         technique                        45380, 56, Colonoscopy, flexible; with biopsy, single                         or multiple Diagnosis Code(s):     --- Professional ---                        Z86.010, Personal history of colonic polyps                         K63.5, Polyp of colon                        K64.0, First degree hemorrhoids                        K57.30, Diverticulosis of large intestine without                         perforation or abscess without bleeding CPT copyright 2019 American Medical Association. All rights reserved. The codes documented in this report are preliminary and upon coder review may  be revised to meet current compliance requirements. Andrey Farmer MD, MD 01/05/2022 9:34:47 AM Number of Addenda: 0 Note Initiated On: 01/05/2022 8:58 AM Scope Withdrawal Time: 0 hours 12 minutes 33 seconds  Total Procedure Duration: 0 hours 16 minutes 0 seconds  Estimated Blood Loss:  Estimated blood loss was minimal.      [facility]

## 2022-01-05 NOTE — Anesthesia Postprocedure Evaluation (Signed)
Anesthesia Post Note  Patient: Steven Tate  Procedure(s) Performed: COLONOSCOPY WITH PROPOFOL  Patient location during evaluation: Endoscopy Anesthesia Type: General Level of consciousness: awake and alert Pain management: pain level controlled Vital Signs Assessment: post-procedure vital signs reviewed and stable Respiratory status: spontaneous breathing, nonlabored ventilation and respiratory function stable Cardiovascular status: blood pressure returned to baseline and stable Postop Assessment: no apparent nausea or vomiting Anesthetic complications: no   No notable events documented.   Last Vitals:  Vitals:   01/05/22 0936 01/05/22 0946  BP:    Pulse: 71   Resp:    Temp:    SpO2: 98% 100%    Last Pain:  Vitals:   01/05/22 0946  TempSrc:   PainSc: 0-No pain                 [place] [employer]

## 2022-01-05 NOTE — Interval H&P Note (Signed)
History and Physical Interval Note:  01/05/2022 9:00 AM  Steven Tate  has presented today for surgery, with the diagnosis of HX OF ADENOMATOUS POLYP OF COLON.  The various methods of treatment have been discussed with the patient and family. After consideration of risks, benefits and other options for treatment, the patient has consented to  Procedure(s): COLONOSCOPY WITH PROPOFOL (N/A) as a surgical intervention.  The patient's history has been reviewed, patient examined, no change in status, stable for surgery.  I have reviewed the patient's chart and labs.  Questions were answered to the patient's satisfaction.     Lesly Rubenstein  Ok to proceed with colonoscopy

## 2022-01-05 NOTE — Transfer of Care (Signed)
Immediate Anesthesia Transfer of Care Note  Patient: Steven Tate  Procedure(s) Performed: COLONOSCOPY WITH PROPOFOL  Patient Location: PACU  Anesthesia Type:General  Level of Consciousness: awake and alert   Airway & Oxygen Therapy: Patient Spontanous Breathing and Patient connected to nasal cannula oxygen  Post-op Assessment: Report given to RN  Post vital signs: Reviewed and stable  Last Vitals:  Vitals Value Taken Time  BP 101/70 01/05/22 0926  Temp    Pulse 71 01/05/22 0926  Resp 19 01/05/22 0926  SpO2 95 % 01/05/22 0926  Vitals shown include unvalidated device data.  Last Pain:  Vitals:   01/05/22 0755  TempSrc: Temporal         Complications: No notable events documented.

## 2022-01-05 NOTE — Anesthesia Preprocedure Evaluation (Addendum)
Anesthesia Evaluation  Patient identified by MRN, date of birth, ID band Patient awake    Reviewed: Allergy & Precautions, NPO status , Patient's Chart, lab work & pertinent test results  Airway Mallampati: II  TM Distance: >3 FB Neck ROM: full    Dental no notable dental hx.    Pulmonary neg pulmonary ROS,    Pulmonary exam normal        Cardiovascular negative cardio ROS Normal cardiovascular exam     Neuro/Psych negative neurological ROS  negative psych ROS   GI/Hepatic negative GI ROS, Neg liver ROS,   Endo/Other  negative endocrine ROS  Renal/GU negative Renal ROS  negative genitourinary   Musculoskeletal   Abdominal (+) + obese,   Peds  Hematology negative hematology ROS (+)   Anesthesia Other Findings Past Medical History: No date: Arthritis No date: Chronic kidney disease     Comment:  hx of uti  No date: Pneumonia     Comment:  hx of 2001  Past Surgical History: No date: APPENDECTOMY No date: BACK SURGERY 05/06/2015: COLONOSCOPY WITH PROPOFOL; N/A     Comment:  Procedure: COLONOSCOPY WITH PROPOFOL;  Surgeon: Lollie Sails, MD;  Location: [facility] ENDOSCOPY;  Service:               Endoscopy;  Laterality: N/A; 01/15/2019: COLONOSCOPY WITH PROPOFOL; N/A     Comment:  Procedure: COLONOSCOPY WITH PROPOFOL;  Surgeon:               Lollie Sails, MD;  Location: [facility] ENDOSCOPY;                Service: Endoscopy;  Laterality: N/A; No date: HAND TENDON SURGERY No date: TONSILLECTOMY 02/22/2012: TOTAL HIP ARTHROPLASTY     Comment:  Procedure: TOTAL HIP ARTHROPLASTY ANTERIOR APPROACH;                Surgeon: Mauri Pole, MD;  Location: [facility] ORS;  Service:              Orthopedics;  Laterality: Right; 09/12/2012: TOTAL HIP ARTHROPLASTY; Left     Comment:  Procedure: TOTAL HIP ARTHROPLASTY ANTERIOR APPROACH;                Surgeon: Mauri Pole, MD;  Location: [facility] ORS;  Service:               Orthopedics;  Laterality: Left;     Reproductive/Obstetrics negative OB ROS                            Anesthesia Physical Anesthesia Plan  ASA: 2  Anesthesia Plan: General   Post-op Pain Management: Minimal or no pain anticipated   Induction: Intravenous  PONV Risk Score and Plan: Propofol infusion, TIVA and Treatment may vary due to age or medical condition  Airway Management Planned: Natural Airway  Additional Equipment:   Intra-op Plan:   Post-operative Plan:   Informed Consent: I have reviewed the patients History and Physical, chart, labs and discussed the procedure including the risks, benefits and alternatives for the proposed anesthesia with the patient or authorized representative who has indicated his/her understanding and acceptance.     Dental Advisory Given  Plan Discussed with: Anesthesiologist, CRNA and Surgeon  Anesthesia Plan Comments:        Anesthesia Quick Evaluation

## 2022-01-05 NOTE — H&P (Signed)
Outpatient short stay form Pre-procedure 01/05/2022  Steven Rubenstein, MD  Primary Physician: Caryl Bis, MD  Reason for visit:  Surveillance  History of present illness:    66 y/o gentleman with no significant medical history besides numerous Ta's in the past with last colonoscopy in 2020 with 8 small Ta's here for surveillance colonoscopy. No blood thinners. No known family history of GI malignancies. No significant abdominal surgeries.    Current Facility-Administered Medications:    0.9 %  sodium chloride infusion, , Intravenous, Continuous, Nianna Igo, Hilton Cork, MD, Last Rate: 20 mL/hr at 01/05/22 4920, Continued from Pre-op at 01/05/22 0821  Medications Prior to Admission  Medication Sig Dispense Refill Last Dose   Multiple Vitamin (MULTIVITAMIN) tablet Take 1 tablet by mouth daily.      tamsulosin (FLOMAX) 0.4 MG CAPS capsule Take 0.4 mg by mouth daily.   01/04/2022   aspirin EC 325 MG tablet Take 1 tablet (325 mg total) by mouth 2 (two) times daily. (Patient not taking: Reported on 01/15/2019) 60 tablet 0    docusate sodium 100 MG CAPS Take 100 mg by mouth 2 (two) times daily. 10 capsule     tadalafil (CIALIS) 5 MG tablet Take 5 mg by mouth daily as needed for erectile dysfunction.      Testosterone Enanthate (XYOSTED) 75 MG/0.5ML SOAJ Inject 75 mg into the skin every 7 (seven) days.        No Known Allergies   Past Medical History:  Diagnosis Date   Arthritis    Chronic kidney disease    hx of uti    Pneumonia    hx of 2001    Review of systems:  Otherwise negative.    Physical Exam  Gen: Alert, oriented. Appears stated age.  HEENT: PERRLA. Lungs: No respiratory distress CV: RRR Abd: soft, benign, no masses Ext: No edema    Planned procedures: Proceed with colonoscopy. The patient understands the nature of the planned procedure, indications, risks, alternatives and potential complications including but not limited to bleeding, infection, perforation,  damage to internal organs and possible oversedation/side effects from anesthesia. The patient agrees and gives consent to proceed.  Please refer to procedure notes for findings, recommendations and patient disposition/instructions.     Steven Rubenstein, MD [facility]

## 2022-01-06 ENCOUNTER — Encounter: Payer: Self-pay | Admitting: Gastroenterology

## 2022-01-06 LAB — SURGICAL PATHOLOGY

## 2022-02-27 ENCOUNTER — Other Ambulatory Visit: Payer: 59

## 2022-03-15 ENCOUNTER — Telehealth: Payer: Self-pay | Admitting: Genetic Counselor

## 2022-03-15 NOTE — Telephone Encounter (Signed)
Answered patient billing questions. Patient wished to reschedule genetics appt for 10/9 at 11am.

## 2022-03-29 ENCOUNTER — Inpatient Hospital Stay: Payer: Medicare Other

## 2022-03-29 ENCOUNTER — Other Ambulatory Visit: Payer: Self-pay

## 2022-03-29 ENCOUNTER — Inpatient Hospital Stay: Payer: Medicare Other | Attending: Genetic Counselor | Admitting: Genetic Counselor

## 2022-03-29 ENCOUNTER — Encounter: Payer: 59 | Admitting: Genetic Counselor

## 2022-03-29 ENCOUNTER — Other Ambulatory Visit: Payer: 59

## 2022-03-29 DIAGNOSIS — Z800 Family history of malignant neoplasm of digestive organs: Secondary | ICD-10-CM | POA: Diagnosis not present

## 2022-03-29 DIAGNOSIS — Z86010 Personal history of colonic polyps: Secondary | ICD-10-CM | POA: Diagnosis not present

## 2022-03-29 NOTE — Progress Notes (Unsigned)
REFERRING PROVIDER: Ok Edwards, NP [name],  [place] [address]  PRIMARY PROVIDER:  Caryl Bis, MD  PRIMARY REASON FOR VISIT:  No diagnosis found.   HISTORY OF PRESENT ILLNESS:   Steven Tate, a 66 y.o. male, was seen for a [place] cancer genetics consultation at the request of Dr. Jacqulyn Liner due to a personal history of colon polyps.  Steven Tate presents to clinic today to discuss the possibility of a hereditary predisposition to cancer, to discuss genetic testing, and to further clarify his future cancer risks, as well as potential cancer risks for family members.   Steven Tate is a 66 y.o. male with no personal history of cancer.  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   CANCER HISTORY:  Oncology History   No history exists.     RISK FACTORS:  Mammogram within the last year: *** Number of breast biopsies: {Numbers 1-12 multi-select:20307}. Colonoscopy: {Yes/No-Ex:120004}; {normal/abnormal/not examined:14677}. Hysterectomy: {Yes/No-Ex:120004}.  Ovaries intact: {Yes/No-Ex:120004}.  Up to date with pelvic exams: {Yes/No-Ex:120004}. Menarche was at age ***.  First live birth at age ***.  Menopausal status: {Menopause:31378}.  OCP use for approximately {Numbers 1-12 multi-select:20307} years.  HRT use: {Numbers 1-12 multi-select:20307} years. Any excessive radiation exposure in the past: {Yes/No-Ex:120004}  Past Medical History:  Diagnosis Date   Arthritis    Chronic kidney disease    hx of uti    Pneumonia    hx of 2001    Past Surgical History:  Procedure Laterality Date   APPENDECTOMY     BACK SURGERY     COLONOSCOPY WITH PROPOFOL N/A 05/06/2015   Procedure: COLONOSCOPY WITH PROPOFOL;  Surgeon: Lollie Sails, MD;  Location: [facility] ENDOSCOPY;  Service: Endoscopy;  Laterality: N/A;   COLONOSCOPY WITH  PROPOFOL N/A 01/15/2019   Procedure: COLONOSCOPY WITH PROPOFOL;  Surgeon: Lollie Sails, MD;  Location: [facility] [place] ENDOSCOPY;  Service: Endoscopy;  Laterality: N/A;   COLONOSCOPY WITH PROPOFOL N/A 01/05/2022   Procedure: COLONOSCOPY WITH PROPOFOL;  Surgeon: Lesly Rubenstein, MD;  Location: [facility] ENDOSCOPY;  Service: Endoscopy;  Laterality: N/A;   HAND TENDON SURGERY     TONSILLECTOMY     TOTAL HIP ARTHROPLASTY  02/22/2012   Procedure: TOTAL HIP ARTHROPLASTY ANTERIOR APPROACH;  Surgeon: Mauri Pole, MD;  Location: [facility] ORS;  Service: Orthopedics;  Laterality: Right;   TOTAL HIP ARTHROPLASTY Left 09/12/2012   Procedure: TOTAL HIP ARTHROPLASTY ANTERIOR APPROACH;  Surgeon: Mauri Pole, MD;  Location: [facility] ORS;  Service: Orthopedics;  Laterality: Left;    FAMILY HISTORY:  We obtained a detailed, 4-generation family history.  Significant diagnoses are listed below: No family history on file.  Steven Tate is {aware/unaware} of previous family history of genetic testing for hereditary cancer risks. Patient's maternal ancestors are of *** descent, and paternal ancestors are of *** descent. There {IS NO:12509} reported Ashkenazi Jewish ancestry. There {IS NO:12509} known consanguinity.  GENETIC COUNSELING ASSESSMENT: Steven Tate is a 66 y.o. male with a {Personal/family:20331} history of {cancer/polyps} which is somewhat suggestive of a {DISEASE} and predisposition to cancer given ***. We, therefore, discussed and recommended the following at today's visit.   DISCUSSION: We discussed that *** - ***% of *** is hereditary.  Most cases of *** associated with ***.  There are other genes that can be associated with hereditary *** cancer syndromes.  These include ***.  We discussed that testing is beneficial for several reasons including knowing how to follow individuals for their cancer risks, identifying whether potential treatment options *** would be beneficial, and understanding if other family members could  be at risk for cancer and allowing them to undergo genetic testing.   We reviewed the characteristics, features and inheritance patterns of hereditary cancer syndromes. We also discussed genetic testing, including the appropriate family members to test, the process of testing, insurance coverage and turn-around-time for results. We discussed the implications of a negative, positive, carrier and/or variant of uncertain significant result. We recommended Steven Tate pursue genetic testing for a panel that includes genes associated with *** cancer.   Steven Tate  was offered a common hereditary cancer panel (47 genes) and an expanded pan-cancer panel (77 genes). Steven Tate was informed of the benefits and limitations of each panel, including that expanded pan-cancer panels contain genes that do not have clear management guidelines at this point in time.  We also discussed that as the number of genes included on a panel increases, the chances of variants of uncertain significance increases.  After considering the benefits and limitations of each gene panel, Steven Tate  elected to have *** through ***.   Based on Steven Tate {Personal/family:20331} history of cancer, he meets medical criteria for genetic testing. Despite that he meets criteria, he may still have an out of pocket cost. We discussed that if his out of pocket cost for testing is over $100, the laboratory should contact him and discuss the self-pay prices and/or patient pay assistance programs.    ***We reviewed the characteristics, features and inheritance patterns of hereditary cancer syndromes. We also discussed genetic testing, including the appropriate family members to test, the process of testing, insurance coverage and turn-around-time for results. We discussed the implications of a negative, positive and/or variant of uncertain significant result. In order to get genetic test results in a timely manner so that Steven Tate can use these  genetic test results for surgical decisions, we recommended Steven Tate pursue genetic testing for the ***. Once complete, we recommend Steven Tate pursue reflex genetic testing to the *** gene panel.   Based on Steven Tate {Personal/family:20331} history of cancer, he meets medical criteria for genetic testing. Despite that he meets criteria, he may still have an out of pocket cost.   ***We discussed with Steven Tate that the {Personal/family:20331} history does not meet insurance or NCCN criteria for genetic testing and, therefore, is not highly consistent with a familial hereditary cancer syndrome.  We feel he is at low risk to harbor a gene mutation associated with such a condition. Thus, we did not recommend any genetic testing, at this time, and recommended Steven Tate continue to follow the cancer screening guidelines given by his primary healthcare provider.  ***In order to estimate his chance of having a {CA GENE:62345} mutation, we used statistical models ({GENMODELS:62370}) that consider his personal medical history, family history and ancestry.  Because each model is different, there can be a lot of variability in the risks they give.  Therefore, these numbers must be considered a rough range and not a precise risk of having a {CA GENE:62345} mutation.  These models estimate that she has approximately a ***-***% chance of having a mutation. Based on this assessment of her family and personal history, genetic testing {IS/ISNOT:34056} recommended.  ***Based on the patient's {Personal/family:20331} history, a statistical model ({GENMODELS:62370}) was used to estimate his risk of  developing {CA [id number]. This estimates his lifetime risk of developing {CA HX:54794} to be approximately ***%. This estimation does not consider any genetic testing results.  The patient's lifetime breast cancer risk is a preliminary estimate based on available information using one of several models endorsed by the  [place] (ACS). The ACS recommends consideration of breast MRI screening as an adjunct to mammography for patients at high risk (defined as 20% or greater lifetime risk).   ***Steven Tate has been determined to be at high risk for breast cancer.  Therefore, we recommend that annual screening with mammography and breast MRI be performed.  ***begin at age 51, or 10 years prior to the age of breast cancer diagnosis in a relative (whichever is earlier).  We discussed that Steven Tate should discuss her individual situation with her referring physician and determine a breast cancer screening plan with which they are both comfortable.    PLAN: After considering the risks, benefits, and limitations, Steven Tate provided informed consent to pursue genetic testing and the blood sample was sent to {Lab} Laboratories for analysis of the {test}. Results should be available within approximately {TAT TIME} weeks' time, at which point they will be disclosed by telephone to Mr. Spivack, as will any additional recommendations warranted by these results. Mr. Haubner will receive a summary of his genetic counseling visit and a copy of his results once available. This information will also be available in Epic.   *** Despite our recommendation, Mr. Christoffel did not wish to pursue genetic testing at today's visit. We understand this decision and remain available to coordinate genetic testing at any time in the future. We, therefore, recommend Mr. Albor continue to follow the cancer screening guidelines given by his primary healthcare provider.  ***Based on Mr. Stooksbury family history, we recommended his ***, who was diagnosed with *** at age ***, have genetic counseling and testing. Mr. Arteaga will let us know if we can be of any assistance in coordinating genetic counseling and/or testing for this family member.   Lastly, we encouraged Mr. Tamburrino to remain in contact with cancer genetics annually so that we  can continuously update the family history and inform him of any changes in cancer genetics and testing that may be of benefit for this family.   Mr. Bailly questions were answered to his satisfaction today. Our contact information was provided should additional questions or concerns arise. Thank you for the referral and allowing [place] to share in the care of your patient.   Mikias Lanz M. Joette Catching, [place], [facility] Genetic Counselor [email or web address] (P) [phone]  The patient was seen for a total of *** minutes in face-to-face genetic counseling.  ***The was accompanied by ***.  ***The patient was seen alone.  Drs. Lindi Adie and/or Burr Medico were available to discuss this case as needed.    _______________________________________________________________________ For Office Staff:  Number of people involved in session: *** Was an Intern/ student involved with case: {YES/NO:63}

## 2022-03-30 ENCOUNTER — Encounter: Payer: Self-pay | Admitting: Genetic Counselor

## 2022-03-30 DIAGNOSIS — Z860100 Personal history of colon polyps, unspecified: Secondary | ICD-10-CM | POA: Insufficient documentation

## 2022-03-30 DIAGNOSIS — Z800 Family history of malignant neoplasm of digestive organs: Secondary | ICD-10-CM

## 2022-03-30 HISTORY — DX: Family history of malignant neoplasm of digestive organs: Z80.0

## 2022-03-30 HISTORY — DX: Personal history of colon polyps, unspecified: Z86.0100

## 2022-04-07 ENCOUNTER — Telehealth: Payer: Self-pay | Admitting: Genetic Counselor

## 2022-04-07 NOTE — Telephone Encounter (Signed)
Called patient to let him know [facility] confirmed $0 OOP cost with Medicare B plan.  Patient currently in [place] for next three weeks.  Pt said he is interested in testing and will reach out when ready to set up blood draw for genetic testing.

## 2022-04-27 ENCOUNTER — Telehealth: Payer: Self-pay | Admitting: Genetic Counselor

## 2022-04-27 NOTE — Telephone Encounter (Signed)
Patient wishes to proceed with genetic testing.  Blood draw scheduled for 11/9 at 9:30am at [facility].

## 2022-04-29 ENCOUNTER — Inpatient Hospital Stay: Payer: Medicare Other | Attending: Genetic Counselor

## 2022-05-03 ENCOUNTER — Telehealth: Payer: Self-pay | Admitting: Genetic Counselor

## 2022-05-03 NOTE — Telephone Encounter (Signed)
Submitted order for [facility] Common Hereditary Cancers +RNA Panel.

## 2022-05-18 ENCOUNTER — Ambulatory Visit: Payer: Self-pay | Admitting: Genetic Counselor

## 2022-05-18 ENCOUNTER — Encounter: Payer: Self-pay | Admitting: Genetic Counselor

## 2022-05-18 ENCOUNTER — Telehealth: Payer: Self-pay | Admitting: Genetic Counselor

## 2022-05-18 DIAGNOSIS — Z86010 Personal history of colonic polyps: Secondary | ICD-10-CM

## 2022-05-18 DIAGNOSIS — Z1379 Encounter for other screening for genetic and chromosomal anomalies: Secondary | ICD-10-CM | POA: Insufficient documentation

## 2022-05-18 DIAGNOSIS — Z800 Family history of malignant neoplasm of digestive organs: Secondary | ICD-10-CM

## 2022-05-18 NOTE — Telephone Encounter (Signed)
Revealed negative genetics.   

## 2022-05-18 NOTE — Progress Notes (Signed)
HPI:   Steven Tate was previously seen in the [facility] due to a personal history of colon polyps and concerns regarding a hereditary predisposition to cancer. Please refer to our prior cancer genetics clinic note for more information regarding our discussion, assessment and recommendations, at the time. Steven Tate recent genetic test results were disclosed to him, as were recommendations warranted by these results. These results and recommendations are discussed in more detail below.  CANCER HISTORY:    Steven Tate is a 66 y.o. male with no personal history of cancer.  He has a history of more than 20 lifetime tubular adenomas (2 adenomas in 2023, 9 adenomas in 2020, 5 adenomas in 2016, and 5 adenomas in 2013).  At this time, follow-up colonoscopy is planned for five years.   FAMILY HISTORY:  We obtained a detailed, 4-generation family history.  Significant diagnoses are listed below:      Family History  Problem Relation Age of Onset   Cancer Father 76        unknown primary; mets; d. 56   Cancer Paternal Aunt          unknown type; d. 69s   Lung cancer Paternal Uncle          d. 53s   Colon cancer Paternal Grandmother          d. late 62s       Steven Tate is unaware of previous family history of genetic testing for hereditary cancer risks. There is no reported Ashkenazi Jewish ancestry. There is no known consanguinity.    GENETIC TEST RESULTS:  The [facility] Common Hereditary Cancers +RNA Panel found no pathogenic mutations.  The Common Hereditary Cancers + RNA Panel offered by [facility] includes sequencing, deletion/duplication, and RNA testing of the following 48 genes: APC, ATM, AXIN2, BARD1, BMPR1A, BRCA1, BRCA2, BRIP1, CDH1, CDK4*, CDKN2A (p14ARF)*, CDKN2A (p16INK4a)*, CHEK2, CTNNA1, DICER1, EPCAM (Deletion/duplication testing only), GREM1 (promoter region deletion/duplication testing only), KIT, MBD4, MEN1, MLH1, MSH2, MSH3, MSH6, MUTYH, NBN, NF1, NHTL1,  PALB2, PDGFRA*, PMS2, POLD1, POLE, PTEN, RAD50, RAD51C, RAD51D, SDHB, SDHC, SDHD, SMAD4, SMARCA4. STK11, TP53, TSC1, TSC2, and VHL.  The following genes were evaluated for sequence changes only: SDHA and HOXB13 c.251G>A variant only.  RNA analysis is not performed for the * genes.     The test report has been scanned into EPIC and is located under the Molecular Pathology section of the Results Review tab.  A portion of the Tate report is included below for reference. Genetic testing reported out on May 11, 2022.     Even though a pathogenic variant was not identified, possible explanations for the cancer/polyps in the family may include: There may be no hereditary risk for cancer/polyps in the family. The cancers/colon polyps in Steven Tate and/or his family may be sporadic/familial or due to other genetic and environmental factors. There may be a gene mutation in one of these genes that current testing methods cannot detect but that chance is small. There could be another gene that has not yet been discovered, or that we have not yet tested, that is responsible for the cancer/polyp diagnoses in the family.   Therefore, it is important to remain in touch with cancer genetics in the future so that we can continue to offer Steven Tate the most up to date genetic testing.    ADDITIONAL GENETIC TESTING:  We discussed with Steven Tate that his genetic testing was fairly extensive.  If there are  additional relevant genes identified to increase cancer risk that can be analyzed in the future, we would be happy to discuss and coordinate this testing at that time.      CANCER SCREENING RECOMMENDATIONS:  Steven Tate is considered negative (normal).  This means that we have not identified a hereditary cause for his personal history of colon polyps at this time.   This negative genetic test simply tells [place] that we cannot yet define why Steven Tate has had more than 20 colon polyps. [name] medical management and screening should be based on the prospect that he  will likely form more colon polyps in the future and should, therefore, undergo more frequent colonoscopy screening at intervals determined by his GI providers.  He should continue to follow cancer screening recommendations provided by his GI provider and his primary care provider.   RECOMMENDATIONS FOR FAMILY MEMBERS:   Individuals in this family might be at some increased risk of developing cancer, over the general population risk, due to the family history of cancer.  Individuals in the family should notify their providers of the family history of cancer and colon polyps.  They should have colonoscopies as recommended by their GI providers.   FOLLOW-UP:  Lastly, we discussed with Steven Tate that cancer genetics is a rapidly advancing field and it is possible that new genetic tests will be appropriate for him and/or his family members in the future. We encouraged him to remain in contact with cancer genetics on an annual basis so we can update his personal and family histories and let him know of advances in cancer genetics that may benefit this family.   Our contact number was provided. Steven Tate questions were answered to his satisfaction, and he knows he is welcome to call us at anytime with additional questions or concerns.   Wayne Wicklund M. Joette Catching, [place], [facility] Genetic Counselor [email or web address] (P) [phone]

## 2022-12-10 DIAGNOSIS — E291 Testicular hypofunction: Secondary | ICD-10-CM | POA: Insufficient documentation

## 2022-12-10 DIAGNOSIS — Z87898 Personal history of other specified conditions: Secondary | ICD-10-CM | POA: Insufficient documentation

## 2022-12-10 DIAGNOSIS — N529 Male erectile dysfunction, unspecified: Secondary | ICD-10-CM | POA: Insufficient documentation

## 2022-12-10 NOTE — Progress Notes (Deleted)
History of Present Illness: Steven Tate is a 67 y.o. male who presents today as a new patient at [facility] [place]. All available relevant medical records have been reviewed.  - GU History: 1. BPH. - 06/19/2018: MR prostate showed "No findings suspicious for high-grade macroscopic prostate cancer on MRI. Nodularity of the central gland, which indents the base of the bladder, suggesting BPH. Calculated prostate volume 54 mL." - ***Taking Flomax.  2. Elevated PSA. - No ***PSA values found per chart review. - He {Actions; denies-reports:120008} family history of prostate cancer. 3. Hypogonadism. 4. Erectile dysfunction.  Today:  He reports chief complaint of ***  He reports *** urinary stream. He {Actions; denies-reports:120008} urinary hesitancy, urgency, frequency, dysuria, gross hematuria, straining to void, or sensations of incomplete emptying.   Fall Screening: Do you usually have a device to assist in your mobility? {yes/no:20286} ***cane / ***walker / ***wheelchair  Medications: Current Outpatient Medications  Medication Sig Dispense Refill   aspirin EC 325 MG tablet Take 1 tablet (325 mg total) by mouth 2 (two) times daily. (Patient not taking: Reported on 01/15/2019) 60 tablet 0   docusate sodium 100 MG CAPS Take 100 mg by mouth 2 (two) times daily. 10 capsule    Multiple Vitamin (MULTIVITAMIN) tablet Take 1 tablet by mouth daily.     tadalafil (CIALIS) 5 MG tablet Take 5 mg by mouth daily as needed for erectile dysfunction.     tamsulosin (FLOMAX) 0.4 MG CAPS capsule Take 0.4 mg by mouth daily.     Testosterone Enanthate (XYOSTED) 75 MG/0.5ML SOAJ Inject 75 mg into the skin every 7 (seven) days.     No current facility-administered medications for this visit.    Allergies: No Known Allergies  Past Medical History:  Diagnosis Date   Arthritis    Chronic kidney disease    hx of uti    Family history of colon cancer 03/30/2022   Personal history of  colonic polyps 03/30/2022   Pneumonia    hx of 2001   Past Surgical History:  Procedure Laterality Date   APPENDECTOMY     BACK SURGERY     COLONOSCOPY WITH PROPOFOL N/A 05/06/2015   Procedure: COLONOSCOPY WITH PROPOFOL;  Surgeon: Christena Deem, MD;  Location: [facility] ENDOSCOPY;  Service: Endoscopy;  Laterality: N/A;   COLONOSCOPY WITH PROPOFOL N/A 01/15/2019   Procedure: COLONOSCOPY WITH PROPOFOL;  Surgeon: Christena Deem, MD;  Location: [facility] ENDOSCOPY;  Service: Endoscopy;  Laterality: N/A;   COLONOSCOPY WITH PROPOFOL N/A 01/05/2022   Procedure: COLONOSCOPY WITH PROPOFOL;  Surgeon: Regis Bill, MD;  Location: [facility] ENDOSCOPY;  Service: Endoscopy;  Laterality: N/A;   HAND TENDON SURGERY     TONSILLECTOMY     TOTAL HIP ARTHROPLASTY  02/22/2012   Procedure: TOTAL HIP ARTHROPLASTY ANTERIOR APPROACH;  Surgeon: Shelda Pal, MD;  Location: [facility] ORS;  Service: Orthopedics;  Laterality: Right;   TOTAL HIP ARTHROPLASTY Left 09/12/2012   Procedure: TOTAL HIP ARTHROPLASTY ANTERIOR APPROACH;  Surgeon: Shelda Pal, MD;  Location: [facility] ORS;  Service: Orthopedics;  Laterality: Left;   Family History  Problem Relation Age of Onset   Cancer Father 30       unknown primary; mets; d. 70   Cancer Paternal Aunt        unknown type; d. 74s   Lung cancer Paternal Uncle        d. 48s   Colon cancer Paternal Grandmother        d.  late 43s   Social History   Socioeconomic History   Marital status: Married    Spouse name: Not on file   Number of children: Not on file   Years of education: Not on file   Highest education level: Not on file  Occupational History   Not on file  Tobacco Use   Smoking status: Never   Smokeless tobacco: Former    Types: Snuff    Quit date: 06/22/2007  Vaping Use   Vaping Use: Never used  Substance and Sexual Activity   Alcohol use: Yes    Comment: occ.    Drug use: No   Sexual activity: Not on file  Other Topics Concern   Not on file  Social History  Narrative   Not on file   Social Determinants of Health   Financial Resource Strain: Not on file  Food Insecurity: Not on file  Transportation Needs: Not on file  Physical Activity: Not on file  Stress: Not on file  Social Connections: Not on file  Intimate Partner Violence: Not on file    SUBJECTIVE  Review of Systems Constitutional: Patient ***denies any unintentional weight loss or change in strength lntegumentary: Patient ***denies any rashes or pruritus Eyes: Patient denies ***dry eyes ENT: Patient ***denies dry mouth Cardiovascular: Patient ***denies chest pain or syncope Respiratory: Patient ***denies shortness of breath Gastrointestinal: Patient ***denies nausea, vomiting, constipation, or diarrhea Musculoskeletal: Patient ***denies muscle cramps or weakness Neurologic: Patient ***denies convulsions or seizures Psychiatric: Patient ***denies memory problems Allergic/Immunologic: Patient ***denies recent allergic reaction(s) Hematologic/Lymphatic: Patient denies bleeding tendencies Endocrine: Patient ***denies heat/cold intolerance  GU: As per HPI.  OBJECTIVE There were no vitals filed for this visit. There is no height or weight on file to calculate BMI.  Physical Examination  Constitutional: ***No obvious distress; patient is ***non-toxic appearing  Cardiovascular: ***No visible lower extremity edema.  Respiratory: The patient does ***not have audible wheezing/stridor; respirations do ***not appear labored  Gastrointestinal: Abdomen ***non-distended Musculoskeletal: ***Normal ROM of UEs  Skin: ***No obvious rashes/open sores  Neurologic: CN 2-12 grossly ***intact Psychiatric: Answered questions ***appropriately with ***normal affect  Hematologic/Lymphatic/Immunologic: ***No obvious bruises or sites of spontaneous bleeding  UA: {Desc; negative/positive:13464} *** WBC/hpf, *** RBC/hpf, bacteria (***) *** nitrites, *** leukocytes, *** blood PVR: ***  ml  ASSESSMENT Benign prostatic hyperplasia, unspecified whether lower urinary tract symptoms present ***  Will plan for follow up in *** months or sooner if needed. Pt verbalized understanding and agreement. All questions were answered.  PLAN Advised the following: *** ***No follow-ups on file.  No orders of the defined types were placed in this encounter.   It has been explained that the patient is to follow regularly with their PCP in addition to all other providers involved in their care and to follow instructions provided by these respective offices. Patient advised to contact urology clinic if any urologic-pertaining questions, concerns, new symptoms or problems arise in the interim period.  There are no Patient Instructions on file for this visit.  Electronically signed by:  Donnita Falls, MSN, FNP-C, CUNP 12/10/2022 1:07 PM

## 2022-12-14 ENCOUNTER — Ambulatory Visit: Payer: Medicare Other | Admitting: Urology

## 2022-12-14 DIAGNOSIS — N400 Enlarged prostate without lower urinary tract symptoms: Secondary | ICD-10-CM

## 2022-12-14 DIAGNOSIS — N529 Male erectile dysfunction, unspecified: Secondary | ICD-10-CM

## 2022-12-14 DIAGNOSIS — E291 Testicular hypofunction: Secondary | ICD-10-CM

## 2022-12-14 DIAGNOSIS — Z87898 Personal history of other specified conditions: Secondary | ICD-10-CM

## 2022-12-14 NOTE — Progress Notes (Unsigned)
Steven Tate DOB: Sep 17, 1955 MRN: [id number]  History of Present Illness: [name] is a 67 y.o. male who presents today as a new patient at [facility] [place]. All available relevant medical records have been reviewed.  - GU History: 1. BPH. Taking Flomax 0.4 mg daily.  2. Elevated PSA. - He denies family history of prostate cancer. - 06/19/2018: MR prostate showed "No findings suspicious for high-grade macroscopic prostate cancer on MRI. Nodularity of the central gland, which indents the base of the bladder, suggesting BPH. Calculated prostate volume 54 mL." 3. Erectile dysfunction. Taking Cialis 5 mg daily. 4. Hypogonadism.  - Previously on testosterone replacement therapy with Xyosted injections.  - Currently taking compounded Testosterone 20% (200 mg/mL) cream (sig: "Apply 1 mL to upper arms, thighs, or to behind the knees once daily as directed") which he receives from [facility] Dr. Marcy Panning  - No prior testosterone values found per chart review; will request from [facility] per patient request. He states it was normal in October 2023.  PSA values:  - 11/26/2022: 4.0 - No additional prior PSA values found per chart review; will request from [facility] per patient request   Today:  He reports doing well with no acute complaints; he presents to establish care. He was previously followed by urology with Dr. Anola Gurney in [place], who recently retired.   He denies increased urinary frequency, dysuria, gross hematuria, straining to void, or sensations of incomplete emptying. He reports some urinary urgency and occasional urge incontinence if he delays urination. He states he used to void up to 5x/night but since starting Flomax and daily Cialis 5 mg he is now only getting up 1-2x/night to urinate on average, sometimes sleeps through the night.   He states the Cialis 5 mg daily is working well for his erectile  dysfunction.   Patient denies symptoms of hypogonadism while taking testosterone replacement therapy - denies fatigue, impaired sexual desire/ libido, diminished muscle mass, weakness, and impaired mood.   Fall Screening: Do you usually have a device to assist in your mobility? No   Medications: Current Outpatient Medications  Medication Sig Dispense Refill   AMBULATORY NON FORMULARY MEDICATION Compounded Testosterone 20% (200 mg/mL) cream. Dispense 90 mL for 90 day supply. Apply 1 mL to upper arms, thighs, or to behind the knees once per day. 1 each 3   tadalafil (CIALIS) 5 MG tablet Take 1 tablet (5 mg total) by mouth daily. 90 tablet 3   tamsulosin (FLOMAX) 0.4 MG CAPS capsule Take 1 capsule (0.4 mg total) by mouth daily. 90 capsule 3   No current facility-administered medications for this visit.    Allergies: No Known Allergies  Past Medical History:  Diagnosis Date   Arthritis    Chronic kidney disease    hx of uti    Family history of colon cancer 03/30/2022   Personal history of colonic polyps 03/30/2022   Pneumonia    hx of 2001   Past Surgical History:  Procedure Laterality Date   APPENDECTOMY     BACK SURGERY     COLONOSCOPY WITH PROPOFOL N/A 05/06/2015   Procedure: COLONOSCOPY WITH PROPOFOL;  Surgeon: Christena Deem, MD;  Location: [facility] ENDOSCOPY;  Service: Endoscopy;  Laterality: N/A;   COLONOSCOPY WITH PROPOFOL N/A 01/15/2019   Procedure: COLONOSCOPY WITH PROPOFOL;  Surgeon: Christena Deem, MD;  Location: [facility] ENDOSCOPY;  Service: Endoscopy;  Laterality: N/A;   COLONOSCOPY WITH PROPOFOL N/A 01/05/2022  Procedure: COLONOSCOPY WITH PROPOFOL;  Surgeon: Regis Bill, MD;  Location: [facility] ENDOSCOPY;  Service: Endoscopy;  Laterality: N/A;   HAND TENDON SURGERY     TONSILLECTOMY     TOTAL HIP ARTHROPLASTY  02/22/2012   Procedure: TOTAL HIP ARTHROPLASTY ANTERIOR APPROACH;  Surgeon: Shelda Pal, MD;  Location: [facility] ORS;  Service: Orthopedics;  Laterality:  Right;   TOTAL HIP ARTHROPLASTY Left 09/12/2012   Procedure: TOTAL HIP ARTHROPLASTY ANTERIOR APPROACH;  Surgeon: Shelda Pal, MD;  Location: [facility] ORS;  Service: Orthopedics;  Laterality: Left;   Family History  Problem Relation Age of Onset   Cancer Father 67       unknown primary; mets; d. 54   Cancer Paternal Aunt        unknown type; d. 72s   Lung cancer Paternal Uncle        d. 71s   Colon cancer Paternal Grandmother        d. late 48s   Social History   Socioeconomic History   Marital status: Married    Spouse name: Not on file   Number of children: Not on file   Years of education: Not on file   Highest education level: Not on file  Occupational History   Not on file  Tobacco Use   Smoking status: Never   Smokeless tobacco: Former    Types: Snuff    Quit date: 06/22/2007  Vaping Use   Vaping Use: Never used  Substance and Sexual Activity   Alcohol use: Yes    Comment: occ.    Drug use: No   Sexual activity: Not on file  Other Topics Concern   Not on file  Social History Narrative   Not on file   Social Determinants of Health   Financial Resource Strain: Not on file  Food Insecurity: Not on file  Transportation Needs: Not on file  Physical Activity: Not on file  Stress: Not on file  Social Connections: Not on file  Intimate Partner Violence: Not on file    SUBJECTIVE  Review of Systems Constitutional: Patient denies any unintentional weight loss or change in strength lntegumentary: Patient denies any rashes or pruritus Eyes: Patient denies dry eyes ENT: Patient denies dry mouth Cardiovascular: Patient denies chest pain or syncope Respiratory: Patient denies shortness of breath Gastrointestinal: Patient denies nausea, vomiting, constipation, or diarrhea Musculoskeletal: Patient denies muscle cramps or weakness Neurologic: Patient denies convulsions or seizures Psychiatric: Patient denies memory problems Allergic/Immunologic: Patient denies recent  allergic reaction(s) Hematologic/Lymphatic: Patient denies bleeding tendencies Endocrine: Patient denies heat/cold intolerance  GU: As per HPI.  OBJECTIVE Vitals:   12/16/22 1030  BP: 119/68  Pulse: 76  Temp: 98.3 F (36.8 C)   There is no height or weight on file to calculate BMI.  Physical Examination  Constitutional: No obvious distress; patient is non-toxic appearing  Cardiovascular: No visible lower extremity edema.  Respiratory: The patient does not have audible wheezing/stridor; respirations do not appear labored  Gastrointestinal: Abdomen non-distended Musculoskeletal: Normal ROM of UEs  Skin: No obvious rashes/open sores  Neurologic: CN 2-12 grossly intact Psychiatric: Answered questions appropriately with normal affect  Hematologic/Lymphatic/Immunologic: No obvious bruises or sites of spontaneous bleeding  UA: negative PVR: 59 ml  ASSESSMENT Benign prostatic hyperplasia, unspecified whether lower urinary tract symptoms present - Plan: Urinalysis, Routine w reflex microscopic, BLADDER SCAN AMB NON-IMAGING, PR COMPLEX UROFLOMETRY, tadalafil (CIALIS) 5 MG tablet, tamsulosin (FLOMAX) 0.4 MG CAPS capsule  History of elevated PSA - Plan:  Urinalysis, Routine w reflex microscopic, BLADDER SCAN AMB NON-IMAGING, PR COMPLEX UROFLOMETRY  Erectile dysfunction, unspecified erectile dysfunction type - Plan: Urinalysis, Routine w reflex microscopic, BLADDER SCAN AMB NON-IMAGING, PR COMPLEX UROFLOMETRY, Testosterone, CBC, tadalafil (CIALIS) 5 MG tablet  Hypogonadism in male - Plan: Urinalysis, Routine w reflex microscopic, BLADDER SCAN AMB NON-IMAGING, PR COMPLEX UROFLOMETRY, Testosterone, CBC, AMBULATORY NON FORMULARY MEDICATION  He is doing well; no acute findings. 1. BPH. Well managed with Flomax 0.4 mg daily and Cialis 5 mg daily. Refills sent. 2. Elevated PSA. Recent PSA was normal. Will plan to recheck in 6 months. Requesting prior results from [facility].  3. Erectile  dysfunction. Well managed with Cialis 5 mg daily. Refills sent. 4. Hypogonadism. Symptoms well managed with compounded testosterone cream. Refills sent. We agreed to check his testosterone and CBC today. We discussed potential risks and benefits of testosterone replacement including erythrocytosis / blood clots.  Will plan for follow up in 6 months or sooner if needed. Pt verbalized understanding and agreement. All questions were answered.  PLAN Advised the following: Labs (CBC & testosterone) today. Continue Flomax 0.4 mg daily. Continue Cialis 5 mg daily.  Requesting prior lab results from [facility] (PSA, testosterone, CBC).  Continue compounded testosterone cream.  Return in about 5 months (around 05/23/2023) for UA, PVR, & f/u with Evette Georges NP.  Orders Placed This Encounter  Procedures   Urinalysis, Routine w reflex microscopic   Testosterone   CBC   PR COMPLEX UROFLOMETRY   BLADDER SCAN AMB NON-IMAGING    It has been explained that the patient is to follow regularly with their PCP in addition to all other providers involved in their care and to follow instructions provided by these respective offices. Patient advised to contact urology clinic if any urologic-pertaining questions, concerns, new symptoms or problems arise in the interim period.  There are no Patient Instructions on file for this visit.  Electronically signed by:  Donnita Falls, MSN, FNP-C, CUNP 12/16/2022 12:53 PM

## 2022-12-16 ENCOUNTER — Ambulatory Visit (INDEPENDENT_AMBULATORY_CARE_PROVIDER_SITE_OTHER): Payer: Medicare Other | Admitting: Urology

## 2022-12-16 ENCOUNTER — Other Ambulatory Visit: Payer: Self-pay | Admitting: Urology

## 2022-12-16 ENCOUNTER — Encounter: Payer: Self-pay | Admitting: Urology

## 2022-12-16 VITALS — BP 119/68 | HR 76 | Temp 98.3°F

## 2022-12-16 DIAGNOSIS — N400 Enlarged prostate without lower urinary tract symptoms: Secondary | ICD-10-CM

## 2022-12-16 DIAGNOSIS — N529 Male erectile dysfunction, unspecified: Secondary | ICD-10-CM

## 2022-12-16 DIAGNOSIS — E291 Testicular hypofunction: Secondary | ICD-10-CM

## 2022-12-16 DIAGNOSIS — R972 Elevated prostate specific antigen [PSA]: Secondary | ICD-10-CM

## 2022-12-16 DIAGNOSIS — Z87898 Personal history of other specified conditions: Secondary | ICD-10-CM

## 2022-12-16 LAB — URINALYSIS, ROUTINE W REFLEX MICROSCOPIC
Bilirubin, UA: NEGATIVE
Glucose, UA: NEGATIVE
Ketones, UA: NEGATIVE
Leukocytes,UA: NEGATIVE
Nitrite, UA: NEGATIVE
Protein,UA: NEGATIVE
RBC, UA: NEGATIVE
Specific Gravity, UA: 1.025 (ref 1.005–1.030)
Urobilinogen, Ur: 0.2 mg/dL (ref 0.2–1.0)
pH, UA: 5.5 (ref 5.0–7.5)

## 2022-12-16 LAB — BLADDER SCAN AMB NON-IMAGING: Scan Result: 59

## 2022-12-16 MED ORDER — TADALAFIL 5 MG PO TABS: 5.0000 mg | ORAL_TABLET | Freq: Every day | ORAL | 3 refills | Status: DC

## 2022-12-16 MED ORDER — AMBULATORY NON FORMULARY MEDICATION: 3 refills | Status: DC

## 2022-12-16 MED ORDER — TAMSULOSIN HCL 0.4 MG PO CAPS: 0.4000 mg | ORAL_CAPSULE | Freq: Every day | ORAL | 3 refills | Status: DC

## 2022-12-16 NOTE — Progress Notes (Signed)
Uroflow  Peak Flow: 33 ml Average Flow: 3 ml Voided Volume: 77 ml Voiding Time: 66 sec Flow Time: 22 sec Time to Peak Flow: 1 sec  PVR Volume: 59 ml   Pt here today for bladder scan. Bladder was scanned and 59 was visualized.   Performed by Kennyth Lose, CMA

## 2022-12-16 NOTE — Progress Notes (Addendum)
MDX tracking # [id number] Confirmation# [id number]

## 2022-12-17 ENCOUNTER — Other Ambulatory Visit: Payer: Self-pay | Admitting: Urology

## 2022-12-17 ENCOUNTER — Encounter: Payer: Self-pay | Admitting: Urology

## 2022-12-17 DIAGNOSIS — E291 Testicular hypofunction: Secondary | ICD-10-CM

## 2022-12-17 LAB — CBC
Hematocrit: 44.2 % (ref 37.5–51.0)
Hemoglobin: 15.1 g/dL (ref 13.0–17.7)
MCH: 30.6 pg (ref 26.6–33.0)
MCHC: 34.2 g/dL (ref 31.5–35.7)
MCV: 90 fL (ref 79–97)
Platelets: 167 10*3/uL (ref 150–450)
RBC: 4.93 x10E6/uL (ref 4.14–5.80)
RDW: 11.7 % (ref 11.6–15.4)
WBC: 8 10*3/uL (ref 3.4–10.8)

## 2022-12-17 LAB — TESTOSTERONE: Testosterone: 226 ng/dL — ABNORMAL LOW (ref 264–916)

## 2022-12-17 NOTE — Progress Notes (Signed)
Please let patient know that his testosterone was a bit low - possibly because he had taken a break from taking his testosterone replacement therapy recently. Advised to continue on the dose he's been taking previously and then let's have him stop by the lab to recheck the testosterone in about 1 month. I'll place that order. OK to keep f/u appointment as scheduled in early December 2024. Also please let him know his hemoglobin was good. Thanks.

## 2023-01-14 ENCOUNTER — Other Ambulatory Visit: Payer: Medicare Other

## 2023-01-14 DIAGNOSIS — E291 Testicular hypofunction: Secondary | ICD-10-CM

## 2023-01-17 ENCOUNTER — Other Ambulatory Visit: Payer: Self-pay | Admitting: Urology

## 2023-01-17 DIAGNOSIS — E291 Testicular hypofunction: Secondary | ICD-10-CM

## 2023-01-17 DIAGNOSIS — N529 Male erectile dysfunction, unspecified: Secondary | ICD-10-CM

## 2023-01-17 NOTE — Progress Notes (Signed)
Please let patient know his testosterone is slightly elevated. He is advised to decrease amount of his compounded testosterone 20% (200 mg/mL) cream from 1 mL to 0.5 ml daily. Will place orders for recheck in 6 weeks. Thanks.

## 2023-05-13 ENCOUNTER — Other Ambulatory Visit: Payer: Medicare Other

## 2023-05-13 DIAGNOSIS — N529 Male erectile dysfunction, unspecified: Secondary | ICD-10-CM

## 2023-05-13 DIAGNOSIS — E291 Testicular hypofunction: Secondary | ICD-10-CM

## 2023-05-14 LAB — TESTOSTERONE: Testosterone: 381 ng/dL (ref 264–916)

## 2023-05-14 LAB — CBC
Hematocrit: 47.5 % (ref 37.5–51.0)
Hemoglobin: 16.4 g/dL (ref 13.0–17.7)
MCH: 31.2 pg (ref 26.6–33.0)
MCHC: 34.5 g/dL (ref 31.5–35.7)
MCV: 91 fL (ref 79–97)
Platelets: 158 10*3/uL (ref 150–450)
RBC: 5.25 x10E6/uL (ref 4.14–5.80)
RDW: 12.5 % (ref 11.6–15.4)
WBC: 7.2 10*3/uL (ref 3.4–10.8)

## 2023-05-17 NOTE — Progress Notes (Signed)
Name: Steven Tate DOB: 04/20/1956 MRN: [id number]  History of Present Illness: Steven Tate is a 67 y.o. male who presents today for follow up visit at [facility] [place]. - GU History: Previously followed by urology with Dr. Anola Gurney in [place].  1. BPH with LUTS (nocturia, urgency, occasional urge incontinence if he delays urination).  - Taking Flomax 0.4 mg daily.  2. Elevated PSA. - He denies family history of prostate cancer. - 06/19/2018: MR prostate showed "No findings suspicious for high-grade macroscopic prostate cancer on MRI. Nodularity of the central gland, which indents the base of the bladder, suggesting BPH. Calculated prostate volume 54 mL." 3. Erectile dysfunction. Taking Cialis 5 mg daily. 4. Hypogonadism.  - Previously on testosterone replacement therapy with Xyosted injections.  - Currently uses compounded testosterone 20% (200 mg/mL) cream 0.5 ml daily.    PSA values:  - 11/26/2022: 4.0 - No additional prior PSA values found per chart review   At initial visit on 12/16/2022: - Reported LUTS & ED well managed on current treatment. - Normal PVR (59 ml). - Testosterone was low (226) despite being on TRT; likely due to patient not taking it for a bit then restarting it a couple weeks prior.   Since last visit: > 01/17/2023:  - Testosterone slightly elevated (1096).  - Advised to decrease amount of his compounded testosterone 20% (200 mg/mL) cream from 1 mL to 0.5 ml daily.   > 05/13/2023:  - Testosterone within normal range (381).  - CBC showed no erythrocytosis (RBC 5.25, hemoglobin 16.4, hematocrit 47.5).  Today: He reports that his urinary symptoms continue to be well managed with Flomax 0.4 mg daily.   He reports that his erectile dysfunction & urinary symptoms continue to be well managed with Cialis 5 mg daily.  He is unsure why PSA was not drawn on 05/13/2023 at lab visit and would like to proceed with that.    Fall  Screening: Do you usually have a device to assist in your mobility? No   Medications: Current Outpatient Medications  Medication Sig Dispense Refill   AMBULATORY NON FORMULARY MEDICATION Compounded Testosterone 20% (200 mg/mL) cream. Dispense 90 mL for 90 day supply. Apply 1 mL to upper arms, thighs, or to behind the knees once per day. 1 each 3   tadalafil (CIALIS) 5 MG tablet Take 1 tablet (5 mg total) by mouth daily. 90 tablet 3   tamsulosin (FLOMAX) 0.4 MG CAPS capsule Take 1 capsule (0.4 mg total) by mouth daily. 90 capsule 3   No current facility-administered medications for this visit.    Allergies: No Known Allergies  Past Medical History:  Diagnosis Date   Arthritis    Family history of colon cancer 03/30/2022   Personal history of colonic polyps 03/30/2022   Pneumonia    hx of 2001   Past Surgical History:  Procedure Laterality Date   APPENDECTOMY     BACK SURGERY     COLONOSCOPY WITH PROPOFOL N/A 05/06/2015   Procedure: COLONOSCOPY WITH PROPOFOL;  Surgeon: Christena Deem, MD;  Location: [facility] ENDOSCOPY;  Service: Endoscopy;  Laterality: N/A;   COLONOSCOPY WITH PROPOFOL N/A 01/15/2019   Procedure: COLONOSCOPY WITH PROPOFOL;  Surgeon: Christena Deem, MD;  Location: [facility] ENDOSCOPY;  Service: Endoscopy;  Laterality: N/A;   COLONOSCOPY WITH PROPOFOL N/A 01/05/2022   Procedure: COLONOSCOPY WITH PROPOFOL;  Surgeon: Regis Bill, MD;  Location: [facility] ENDOSCOPY;  Service: Endoscopy;  Laterality: N/A;   HAND TENDON SURGERY  TONSILLECTOMY     TOTAL HIP ARTHROPLASTY  02/22/2012   Procedure: TOTAL HIP ARTHROPLASTY ANTERIOR APPROACH;  Surgeon: Shelda Pal, MD;  Location: [facility] ORS;  Service: Orthopedics;  Laterality: Right;   TOTAL HIP ARTHROPLASTY Left 09/12/2012   Procedure: TOTAL HIP ARTHROPLASTY ANTERIOR APPROACH;  Surgeon: Shelda Pal, MD;  Location: [facility] ORS;  Service: Orthopedics;  Laterality: Left;   Family History  Problem Relation Age of Onset   Cancer  Father 54       unknown primary; mets; d. 41   Cancer Paternal Aunt        unknown type; d. 61s   Lung cancer Paternal Uncle        d. 46s   Colon cancer Paternal Grandmother        d. late 31s   Social History   Socioeconomic History   Marital status: Married    Spouse name: Not on file   Number of children: Not on file   Years of education: Not on file   Highest education level: Not on file  Occupational History   Not on file  Tobacco Use   Smoking status: Never   Smokeless tobacco: Former    Types: Snuff    Quit date: 06/22/2007  Vaping Use   Vaping status: Never Used  Substance and Sexual Activity   Alcohol use: Yes    Comment: occ.    Drug use: No   Sexual activity: Not on file  Other Topics Concern   Not on file  Social History Narrative   Not on file   Social Determinants of Health   Financial Resource Strain: Not on file  Food Insecurity: Not on file  Transportation Needs: Not on file  Physical Activity: Not on file  Stress: Not on file  Social Connections: Not on file  Intimate Partner Violence: Not on file    Review of Systems Constitutional: Patient denies any unintentional weight loss or change in strength lntegumentary: Patient denies any rashes or pruritus Cardiovascular: Patient denies chest pain or syncope Respiratory: Patient denies shortness of breath Gastrointestinal: Patient denies nausea, vomiting, constipation, or diarrhea Musculoskeletal: Patient denies muscle cramps or weakness Neurologic: Patient denies convulsions or seizures Allergic/Immunologic: Patient denies recent allergic reaction(s) Hematologic/Lymphatic: Patient denies bleeding tendencies Endocrine: Patient denies heat/cold intolerance  GU: As per HPI.  OBJECTIVE Vitals:   05/23/23 1051  BP: 114/70  Pulse: 65  Temp: 97.7 F (36.5 C)   There is no height or weight on file to calculate BMI.  Physical Examination Constitutional: No obvious distress; patient is  non-toxic appearing  Cardiovascular: No visible lower extremity edema.  Respiratory: The patient does not have audible wheezing/stridor; respirations do not appear labored  Gastrointestinal: Abdomen non-distended Musculoskeletal: Normal ROM of UEs  Skin: No obvious rashes/open sores  Neurologic: CN 2-12 grossly intact Psychiatric: Answered questions appropriately with normal affect  Hematologic/Lymphatic/Immunologic: No obvious bruises or sites of spontaneous bleeding  UA: negative  PVR: 367 ml   ASSESSMENT Hypogonadism in male - Plan: Urinalysis, Routine w reflex microscopic, BLADDER SCAN AMB NON-IMAGING, Testosterone, CBC  Erectile dysfunction, unspecified erectile dysfunction type - Plan: Urinalysis, Routine w reflex microscopic, BLADDER SCAN AMB NON-IMAGING, Testosterone, CBC  Benign prostatic hyperplasia, unspecified whether lower urinary tract symptoms present - Plan: Urinalysis, Routine w reflex microscopic, BLADDER SCAN AMB NON-IMAGING, PSA, PSA  History of elevated PSA - Plan: Urinalysis, Routine w reflex microscopic, BLADDER SCAN AMB NON-IMAGING, PSA, PSA  He is doing well with no acute concerns  at this time.   1. BPH with LUTS (nocturia, urgency, occasional urge incontinence if he delays urination). Well managed with Flomax 0.4 mg daily and Cialis 5 mg daily.   2. Elevated PSA. Will check PSA today. Age norms and PSA velocity ("trend") were discussed. Elevated serum PSA may be due to BPH, recent urinary catheterization, prostate infection (prostatitis) / inflammation, trauma, recent ejaculations, or prostate cancer. Findings and implications of elevated PSA discussed with patient.  3. Erectile dysfunction. Well managed with Cialis 5 mg daily.  4. Hypogonadism. Well managed with compounded testosterone 20% (200 mg/mL) cream 0.5 ml daily.    Will plan for follow up in 6 months with labs prior. Pt verbalized understanding and agreement. All questions were  answered.  PLAN Advised the following: 1. PSA today. 2. Continue Flomax 0.4 mg daily. 3. Continue Cialis 5 mg daily.  4. Continue compounded testosterone 20% (200 mg/mL) cream 0.5 ml daily.  5. Return in about 6 months (around 11/21/2023) for UA, PVR, & f/u with Evette Georges NP - with lab visit at least 1 day prior.  Orders Placed This Encounter  Procedures   Urinalysis, Routine w reflex microscopic   PSA   PSA    Standing Status:   Future    Standing Expiration Date:   05/22/2024   Testosterone    Standing Status:   Future    Standing Expiration Date:   05/22/2024   CBC    Standing Status:   Future    Standing Expiration Date:   05/22/2024   BLADDER SCAN AMB NON-IMAGING    It has been explained that the patient is to follow regularly with their PCP in addition to all other providers involved in their care and to follow instructions provided by these respective offices. Patient advised to contact urology clinic if any urologic-pertaining questions, concerns, new symptoms or problems arise in the interim period.  There are no Patient Instructions on file for this visit.  Electronically signed by:  Donnita Falls, FNP   05/23/23    11:24 AM

## 2023-05-23 ENCOUNTER — Encounter: Payer: Self-pay | Admitting: Urology

## 2023-05-23 ENCOUNTER — Ambulatory Visit: Payer: Medicare Other | Admitting: Urology

## 2023-05-23 VITALS — BP 114/70 | HR 65 | Temp 97.7°F

## 2023-05-23 DIAGNOSIS — Z87898 Personal history of other specified conditions: Secondary | ICD-10-CM

## 2023-05-23 DIAGNOSIS — E291 Testicular hypofunction: Secondary | ICD-10-CM

## 2023-05-23 DIAGNOSIS — N400 Enlarged prostate without lower urinary tract symptoms: Secondary | ICD-10-CM | POA: Diagnosis not present

## 2023-05-23 DIAGNOSIS — N529 Male erectile dysfunction, unspecified: Secondary | ICD-10-CM | POA: Diagnosis not present

## 2023-05-23 LAB — URINALYSIS, ROUTINE W REFLEX MICROSCOPIC
Bilirubin, UA: NEGATIVE
Glucose, UA: NEGATIVE
Ketones, UA: NEGATIVE
Leukocytes,UA: NEGATIVE
Nitrite, UA: NEGATIVE
Protein,UA: NEGATIVE
RBC, UA: NEGATIVE
Specific Gravity, UA: 1.03 (ref 1.005–1.030)
Urobilinogen, Ur: 0.2 mg/dL (ref 0.2–1.0)
pH, UA: 6 (ref 5.0–7.5)

## 2023-05-23 LAB — BLADDER SCAN AMB NON-IMAGING: Scan Result: 367

## 2023-05-24 ENCOUNTER — Telehealth: Payer: Self-pay

## 2023-05-24 LAB — PSA: Prostate Specific Ag, Serum: 3.9 ng/mL (ref 0.0–4.0)

## 2023-05-24 NOTE — Telephone Encounter (Signed)
-----   Message from Donnita Falls sent at 05/24/2023  8:43 AM EST ----- Please let pt know his PSA is normal. Follow up as planned. Thanks.

## 2023-05-24 NOTE — Telephone Encounter (Signed)
Tried calling patient with no answer, left vm for return call 

## 2023-05-25 NOTE — Telephone Encounter (Signed)
Patient is made aware and voiced understanding. 

## 2023-11-17 ENCOUNTER — Other Ambulatory Visit: Payer: 59

## 2023-11-17 DIAGNOSIS — N529 Male erectile dysfunction, unspecified: Secondary | ICD-10-CM

## 2023-11-17 DIAGNOSIS — N400 Enlarged prostate without lower urinary tract symptoms: Secondary | ICD-10-CM

## 2023-11-17 DIAGNOSIS — E291 Testicular hypofunction: Secondary | ICD-10-CM

## 2023-11-17 DIAGNOSIS — Z87898 Personal history of other specified conditions: Secondary | ICD-10-CM

## 2023-11-18 ENCOUNTER — Ambulatory Visit: Payer: Self-pay | Admitting: Urology

## 2023-11-18 LAB — CBC
Hematocrit: 44 % (ref 37.5–51.0)
Hemoglobin: 14.9 g/dL (ref 13.0–17.7)
MCH: 31.1 pg (ref 26.6–33.0)
MCHC: 33.9 g/dL (ref 31.5–35.7)
MCV: 92 fL (ref 79–97)
Platelets: 142 10*3/uL — ABNORMAL LOW (ref 150–450)
RBC: 4.79 x10E6/uL (ref 4.14–5.80)
RDW: 12.2 % (ref 11.6–15.4)
WBC: 6 10*3/uL (ref 3.4–10.8)

## 2023-11-18 LAB — PSA: Prostate Specific Ag, Serum: 3.7 ng/mL (ref 0.0–4.0)

## 2023-11-18 LAB — TESTOSTERONE: Testosterone: 568 ng/dL (ref 264–916)

## 2023-11-21 ENCOUNTER — Encounter: Payer: Self-pay | Admitting: Urology

## 2023-11-21 ENCOUNTER — Telehealth: Payer: Self-pay

## 2023-11-21 ENCOUNTER — Ambulatory Visit (INDEPENDENT_AMBULATORY_CARE_PROVIDER_SITE_OTHER): Payer: 59 | Admitting: Urology

## 2023-11-21 VITALS — BP 108/68 | HR 79

## 2023-11-21 DIAGNOSIS — E291 Testicular hypofunction: Secondary | ICD-10-CM

## 2023-11-21 DIAGNOSIS — N529 Male erectile dysfunction, unspecified: Secondary | ICD-10-CM

## 2023-11-21 DIAGNOSIS — Z87898 Personal history of other specified conditions: Secondary | ICD-10-CM

## 2023-11-21 DIAGNOSIS — N400 Enlarged prostate without lower urinary tract symptoms: Secondary | ICD-10-CM | POA: Diagnosis not present

## 2023-11-21 LAB — URINALYSIS, ROUTINE W REFLEX MICROSCOPIC
Bilirubin, UA: NEGATIVE
Glucose, UA: NEGATIVE
Ketones, UA: NEGATIVE
Leukocytes,UA: NEGATIVE
Nitrite, UA: NEGATIVE
Protein,UA: NEGATIVE
RBC, UA: NEGATIVE
Specific Gravity, UA: 1.01 (ref 1.005–1.030)
Urobilinogen, Ur: 0.2 mg/dL (ref 0.2–1.0)
pH, UA: 6.5 (ref 5.0–7.5)

## 2023-11-21 LAB — BLADDER SCAN AMB NON-IMAGING: Scan Result: 166

## 2023-11-21 MED ORDER — TADALAFIL 5 MG PO TABS
5.0000 mg | ORAL_TABLET | Freq: Every day | ORAL | 3 refills | Status: AC
Start: 2023-11-21 — End: ?

## 2023-11-21 MED ORDER — AMBULATORY NON FORMULARY MEDICATION
3 refills | Status: AC
Start: 2023-11-21 — End: ?

## 2023-11-21 MED ORDER — TAMSULOSIN HCL 0.4 MG PO CAPS
0.4000 mg | ORAL_CAPSULE | Freq: Every day | ORAL | 3 refills | Status: AC
Start: 2023-11-21 — End: ?

## 2023-11-21 NOTE — Progress Notes (Signed)
 Name: Steven Tate DOB: [birth year] MRN: [id number]  History of Present Illness: Steven Tate is a 68 y.o. male who presents today for follow up visit at [facility] [place].  GU History includes: 1. BPH with LUTS (nocturia, urgency, occasional urge incontinence if he delays urination).  - Taking Flomax  0.4 mg daily.  2. Elevated PSA. - Denies family history of prostate cancer. - 06/19/2018: MR prostate showed "No findings suspicious for high-grade macroscopic prostate cancer on MRI. Nodularity of the central gland, which indents the base of the bladder, suggesting BPH. Calculated prostate volume 54 mL." 3. Erectile dysfunction.  - Taking Cialis  5 mg daily. 4. Hypogonadism.  - Previously on testosterone  replacement therapy with Xyosted injections.  - Currently uses compounded testosterone  20% (200 mg/mL) cream 0.5 ml daily.    PSA values:  - 11/26/2022: 4.0 - 05/24/2023: 3.9 - 11/17/2023: 3.7   At last visit on 05/23/2023: - LUTS well managed on Flomax  0.4 mg daily.   - ED well managed on Cialis  5 mg daily.   Since last visit: > 11/17/2023:  - Testosterone  normal (568); normal H&H. - PSA 3.7  Today: He reports nocturia x2, which he states is manageable. He denies urinary urgency, frequency, dysuria, gross hematuria, weak urinary stream, hesitancy, straining to void, or sensations of incomplete emptying.  He reports that the Cialis  is  working adequately for management of erectile dysfunction.    Medications: Current Outpatient Medications  Medication Sig Dispense Refill   aspirin  EC 81 MG tablet Take 81 mg by mouth daily. Swallow whole.     AMBULATORY NON FORMULARY MEDICATION Compounded Testosterone  20% (200 mg/mL) cream. Dispense 90 mL for 90 day supply. Apply 1 mL to upper arms, thighs, or to behind the knees once per day. 1 each 3   tadalafil  (CIALIS ) 5 MG tablet Take 1 tablet (5 mg total) by mouth daily. 90 tablet 3   tamsulosin  (FLOMAX ) 0.4 MG CAPS  capsule Take 1 capsule (0.4 mg total) by mouth daily. 90 capsule 3   No current facility-administered medications for this visit.    Allergies: No Known Allergies  Past Medical History:  Diagnosis Date   Arthritis    Family history of colon cancer 03/30/2022   Personal history of colonic polyps 03/30/2022   Pneumonia    hx of 2001   Past Surgical History:  Procedure Laterality Date   APPENDECTOMY     BACK SURGERY     COLONOSCOPY WITH PROPOFOL  N/A 05/06/2015   Procedure: COLONOSCOPY WITH PROPOFOL ;  Surgeon: Deveron Fly, MD;  Location: [facility] ENDOSCOPY;  Service: Endoscopy;  Laterality: N/A;   COLONOSCOPY WITH PROPOFOL  N/A 01/15/2019   Procedure: COLONOSCOPY WITH PROPOFOL ;  Surgeon: Deveron Fly, MD;  Location: [facility] ENDOSCOPY;  Service: Endoscopy;  Laterality: N/A;   COLONOSCOPY WITH PROPOFOL  N/A 01/05/2022   Procedure: COLONOSCOPY WITH PROPOFOL ;  Surgeon: Shane Darling, MD;  Location: [facility] ENDOSCOPY;  Service: Endoscopy;  Laterality: N/A;   HAND TENDON SURGERY     TONSILLECTOMY     TOTAL HIP ARTHROPLASTY  02/22/2012   Procedure: TOTAL HIP ARTHROPLASTY ANTERIOR APPROACH;  Surgeon: Bevin Bucks, MD;  Location: [facility] ORS;  Service: Orthopedics;  Laterality: Right;   TOTAL HIP ARTHROPLASTY Left 09/12/2012   Procedure: TOTAL HIP ARTHROPLASTY ANTERIOR APPROACH;  Surgeon: Bevin Bucks, MD;  Location: [facility] ORS;  Service: Orthopedics;  Laterality: Left;   Family History  Problem Relation Age of Onset   Cancer Father 25  unknown primary; mets; d. 18   Cancer Paternal Aunt        unknown type; d. 65s   Lung cancer Paternal Uncle        d. 66s   Colon cancer Paternal Grandmother        d. late 68s   Social History   Socioeconomic History   Marital status: Married    Spouse name: Not on file   Number of children: Not on file   Years of education: Not on file   Highest education level: Not on file  Occupational History   Not on file  Tobacco Use   Smoking  status: Never   Smokeless tobacco: Former    Types: Snuff    Quit date: 06/22/2007  Vaping Use   Vaping status: Never Used  Substance and Sexual Activity   Alcohol use: Yes    Comment: occ.    Drug use: No   Sexual activity: Not on file  Other Topics Concern   Not on file  Social History Narrative   Not on file   Social Drivers of Health   Financial Resource Strain: Not on file  Food Insecurity: Not on file  Transportation Needs: Not on file  Physical Activity: Not on file  Stress: Not on file  Social Connections: Not on file  Intimate Partner Violence: Not on file    Review of Systems Constitutional: Patient denies any unintentional weight loss or change in strength lntegumentary: Patient denies any rashes or pruritus Cardiovascular: Patient denies chest pain or syncope Respiratory: Patient denies shortness of breath Gastrointestinal: Patient denies constipation Musculoskeletal: Patient denies muscle cramps or weakness Neurologic: Patient denies convulsions or seizures Allergic/Immunologic: Patient denies recent allergic reaction(s) Hematologic/Lymphatic: Patient denies bleeding tendencies Endocrine: Patient denies heat/cold intolerance  GU: As per HPI.  OBJECTIVE Vitals:   11/21/23 1050  BP: 108/68  Pulse: 79   There is no height or weight on file to calculate BMI.  Physical Examination Constitutional: No obvious distress; patient is non-toxic appearing  Cardiovascular: No visible lower extremity edema.  Respiratory: The patient does not have audible wheezing/stridor; respirations do not appear labored  Gastrointestinal: Abdomen non-distended Musculoskeletal: Normal ROM of UEs  Skin: No obvious rashes/open sores  Neurologic: CN 2-12 grossly intact Psychiatric: Answered questions appropriately with normal affect  Hematologic/Lymphatic/Immunologic: No obvious bruises or sites of spontaneous bleeding  UA: negative  PVR: 166 ml  ASSESSMENT Hypogonadism in  male - Plan: Urinalysis, Routine w reflex microscopic, BLADDER SCAN AMB NON-IMAGING, AMBULATORY NON FORMULARY MEDICATION, Testosterone , Hemoglobin and hematocrit, blood  Erectile dysfunction, unspecified erectile dysfunction type - Plan: Urinalysis, Routine w reflex microscopic, BLADDER SCAN AMB NON-IMAGING, tadalafil  (CIALIS ) 5 MG tablet, Testosterone , Hemoglobin and hematocrit, blood  Benign prostatic hyperplasia, unspecified whether lower urinary tract symptoms present - Plan: Urinalysis, Routine w reflex microscopic, BLADDER SCAN AMB NON-IMAGING, tadalafil  (CIALIS ) 5 MG tablet, tamsulosin  (FLOMAX ) 0.4 MG CAPS capsule, PSA  History of elevated PSA - Plan: Urinalysis, Routine w reflex microscopic, BLADDER SCAN AMB NON-IMAGING, PSA  He is doing well with no acute concerns. We reviewed lab results, which are looking good. Will continue current treatment regimen. Refills sent. We agreed to plan for follow up in 6 months with repeat labs (testosterone , H&H, PSA). Patient verbalized understanding of and agreement with current plan. All questions were answered.  PLAN Advised the following: 1. Continue Flomax  0.4 mg daily.   2. Continue Cialis  5 mg daily.  3. Continue compounded testosterone  20% (200 mg/mL) cream 0.5  ml daily. 4. Return in about 6 months (around 05/22/2024) for UA, PVR, & f/u with Griselda Lederer NP with lab visit 3-7 days prior.  Orders Placed This Encounter  Procedures   Urinalysis, Routine w reflex microscopic   Testosterone     Standing Status:   Future    Expected Date:   05/22/2024    Expiration Date:   11/20/2024   PSA    Standing Status:   Future    Expected Date:   05/22/2024    Expiration Date:   11/20/2024   Hemoglobin and hematocrit, blood    Standing Status:   Future    Expected Date:   05/22/2024    Expiration Date:   11/20/2024   BLADDER SCAN AMB NON-IMAGING    It has been explained that the patient is to follow regularly with their PCP in addition to all other  providers involved in their care and to follow instructions provided by these respective offices. Patient advised to contact urology clinic if any urologic-pertaining questions, concerns, new symptoms or problems arise in the interim period.  There are no Patient Instructions on file for this visit.  Electronically signed by:  Lauretta Ponto, FNP   11/21/23    11:25 AM

## 2023-11-21 NOTE — Telephone Encounter (Signed)
 Medication prior authorization request received.  Completed PA request through cover my meds for drug Tadalafil  5 MG Tablet. [name]  Approved: Pending

## 2023-11-21 NOTE — Telephone Encounter (Signed)
-----   Message from Nurse [facility] sent at 11/21/2023 12:35 PM EDT ----- Possible duplicate

## 2024-04-04 ENCOUNTER — Telehealth: Payer: Self-pay | Admitting: Urology

## 2024-04-04 DIAGNOSIS — N529 Male erectile dysfunction, unspecified: Secondary | ICD-10-CM

## 2024-04-04 DIAGNOSIS — N400 Enlarged prostate without lower urinary tract symptoms: Secondary | ICD-10-CM

## 2024-04-04 DIAGNOSIS — E291 Testicular hypofunction: Secondary | ICD-10-CM

## 2024-04-04 NOTE — Telephone Encounter (Signed)
 Return call to pt. Pt is scheduled in late April for labs and OV. Voiced understanding

## 2024-04-04 NOTE — Telephone Encounter (Signed)
 Patient walked into office and wanted to check appt. He will be out of town starting November  He filled out walk in form.   I just learned that Lauraine is no longer with you. I am listed as a patient of Dr Sherrilee. Unfortunately I cannot see him before February 2026. The prescriptions I have listed below will run out before then. I have lab test scheduled 11/18. I would also like to move my lab appointment.  Tamsulosin  1 per day 90 day supply Tadalafil  5mg  1 per day 90 day supply [facility] [address]  Testosterone  20% cream 200mg /ml 1ml per day [facility]  I hope you can accommodate my request. I am almost certain that I could not get a new Dr before all this happens. I would have been here sooner but had no idea Lauraine was gone until today. I have not been able to see Dr. Sherrilee since being a patient.

## 2024-05-08 ENCOUNTER — Other Ambulatory Visit

## 2024-05-15 ENCOUNTER — Ambulatory Visit: Admitting: Urology

## 2024-10-05 ENCOUNTER — Ambulatory Visit: Admitting: Urology

## 2024-10-08 ENCOUNTER — Other Ambulatory Visit

## 2024-10-15 ENCOUNTER — Ambulatory Visit: Admitting: Urology
